# Patient Record
Sex: Male | Born: 1980
Health system: Southern US, Community
[De-identification: ages and names within clinical notes are randomized; demographics above are authoritative.]

## PROBLEM LIST (undated history)

## (undated) DIAGNOSIS — F32A Depression, unspecified: Secondary | ICD-10-CM

## (undated) DIAGNOSIS — I639 Cerebral infarction, unspecified: Secondary | ICD-10-CM

## (undated) DIAGNOSIS — N200 Calculus of kidney: Secondary | ICD-10-CM

## (undated) DIAGNOSIS — F209 Schizophrenia, unspecified: Secondary | ICD-10-CM

## (undated) DIAGNOSIS — I10 Essential (primary) hypertension: Secondary | ICD-10-CM

## (undated) HISTORY — PX: FRACTURE SURGERY: SHX138

## (undated) HISTORY — PX: OTHER SURGICAL HISTORY: SHX169

---
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
## 2000-01-05 ENCOUNTER — Emergency Department (HOSPITAL_COMMUNITY): Admission: EM | Admit: 2000-01-05 | Discharge: 2000-01-05 | Payer: Self-pay | Admitting: Emergency Medicine

## 2000-01-11 ENCOUNTER — Encounter: Admission: RE | Admit: 2000-01-11 | Discharge: 2000-01-11 | Payer: Self-pay | Admitting: Internal Medicine

## 2000-04-29 ENCOUNTER — Encounter: Payer: Self-pay | Admitting: Emergency Medicine

## 2000-04-29 ENCOUNTER — Emergency Department (HOSPITAL_COMMUNITY): Admission: EM | Admit: 2000-04-29 | Discharge: 2000-04-29 | Payer: Self-pay | Admitting: Emergency Medicine

## 2000-06-01 ENCOUNTER — Emergency Department (HOSPITAL_COMMUNITY): Admission: EM | Admit: 2000-06-01 | Discharge: 2000-06-01 | Payer: Self-pay | Admitting: Emergency Medicine

## 2000-06-04 ENCOUNTER — Encounter: Payer: Self-pay | Admitting: Emergency Medicine

## 2000-06-04 ENCOUNTER — Emergency Department (HOSPITAL_COMMUNITY): Admission: EM | Admit: 2000-06-04 | Discharge: 2000-06-04 | Payer: Self-pay | Admitting: Emergency Medicine

## 2000-06-07 ENCOUNTER — Emergency Department (HOSPITAL_COMMUNITY): Admission: EM | Admit: 2000-06-07 | Discharge: 2000-06-08 | Payer: Self-pay | Admitting: *Deleted

## 2000-06-09 ENCOUNTER — Emergency Department (HOSPITAL_COMMUNITY): Admission: EM | Admit: 2000-06-09 | Discharge: 2000-06-09 | Payer: Self-pay | Admitting: Emergency Medicine

## 2000-06-17 ENCOUNTER — Emergency Department (HOSPITAL_COMMUNITY): Admission: EM | Admit: 2000-06-17 | Discharge: 2000-06-17 | Payer: Self-pay | Admitting: Emergency Medicine

## 2000-06-20 ENCOUNTER — Encounter: Payer: Self-pay | Admitting: Emergency Medicine

## 2000-06-20 ENCOUNTER — Emergency Department (HOSPITAL_COMMUNITY): Admission: EM | Admit: 2000-06-20 | Discharge: 2000-06-20 | Payer: Self-pay | Admitting: Emergency Medicine

## 2000-07-07 ENCOUNTER — Emergency Department (HOSPITAL_COMMUNITY): Admission: EM | Admit: 2000-07-07 | Discharge: 2000-07-07 | Payer: Self-pay | Admitting: Emergency Medicine

## 2000-07-07 ENCOUNTER — Encounter: Payer: Self-pay | Admitting: Emergency Medicine

## 2000-09-04 ENCOUNTER — Encounter: Payer: Self-pay | Admitting: Emergency Medicine

## 2000-09-04 ENCOUNTER — Emergency Department (HOSPITAL_COMMUNITY): Admission: EM | Admit: 2000-09-04 | Discharge: 2000-09-04 | Payer: Self-pay | Admitting: Emergency Medicine

## 2000-09-10 ENCOUNTER — Emergency Department (HOSPITAL_COMMUNITY): Admission: EM | Admit: 2000-09-10 | Discharge: 2000-09-10 | Payer: Self-pay | Admitting: Emergency Medicine

## 2000-09-10 ENCOUNTER — Encounter: Payer: Self-pay | Admitting: Emergency Medicine

## 2000-09-22 ENCOUNTER — Emergency Department (HOSPITAL_COMMUNITY): Admission: EM | Admit: 2000-09-22 | Discharge: 2000-09-22 | Payer: Self-pay | Admitting: Emergency Medicine

## 2000-09-27 ENCOUNTER — Emergency Department (HOSPITAL_COMMUNITY): Admission: EM | Admit: 2000-09-27 | Discharge: 2000-09-27 | Payer: Self-pay | Admitting: Emergency Medicine

## 2000-10-15 ENCOUNTER — Emergency Department (HOSPITAL_COMMUNITY): Admission: EM | Admit: 2000-10-15 | Discharge: 2000-10-15 | Payer: Self-pay | Admitting: Emergency Medicine

## 2000-11-12 ENCOUNTER — Emergency Department (HOSPITAL_COMMUNITY): Admission: EM | Admit: 2000-11-12 | Discharge: 2000-11-12 | Payer: Self-pay | Admitting: Emergency Medicine

## 2000-11-12 ENCOUNTER — Encounter: Payer: Self-pay | Admitting: Emergency Medicine

## 2001-01-21 ENCOUNTER — Emergency Department (HOSPITAL_COMMUNITY): Admission: EM | Admit: 2001-01-21 | Discharge: 2001-01-21 | Payer: Self-pay | Admitting: Emergency Medicine

## 2001-01-22 ENCOUNTER — Emergency Department (HOSPITAL_COMMUNITY): Admission: EM | Admit: 2001-01-22 | Discharge: 2001-01-22 | Payer: Self-pay | Admitting: Emergency Medicine

## 2001-05-08 ENCOUNTER — Emergency Department (HOSPITAL_COMMUNITY): Admission: EM | Admit: 2001-05-08 | Discharge: 2001-05-08 | Payer: Self-pay | Admitting: Emergency Medicine

## 2001-06-07 ENCOUNTER — Emergency Department (HOSPITAL_COMMUNITY): Admission: EM | Admit: 2001-06-07 | Discharge: 2001-06-07 | Payer: Self-pay | Admitting: Emergency Medicine

## 2001-06-22 ENCOUNTER — Encounter: Payer: Self-pay | Admitting: Emergency Medicine

## 2001-06-22 ENCOUNTER — Emergency Department (HOSPITAL_COMMUNITY): Admission: EM | Admit: 2001-06-22 | Discharge: 2001-06-22 | Payer: Self-pay

## 2001-07-06 ENCOUNTER — Emergency Department (HOSPITAL_COMMUNITY): Admission: EM | Admit: 2001-07-06 | Discharge: 2001-07-06 | Payer: Self-pay

## 2001-07-08 ENCOUNTER — Encounter: Admission: RE | Admit: 2001-07-08 | Discharge: 2001-10-06 | Payer: Self-pay

## 2001-08-09 ENCOUNTER — Emergency Department (HOSPITAL_COMMUNITY): Admission: EM | Admit: 2001-08-09 | Discharge: 2001-08-10 | Payer: Self-pay | Admitting: *Deleted

## 2001-08-10 ENCOUNTER — Encounter: Payer: Self-pay | Admitting: Emergency Medicine

## 2001-10-10 ENCOUNTER — Encounter: Admission: RE | Admit: 2001-10-10 | Discharge: 2002-01-08 | Payer: Self-pay | Admitting: Anesthesiology

## 2001-10-17 ENCOUNTER — Emergency Department (HOSPITAL_COMMUNITY): Admission: EM | Admit: 2001-10-17 | Discharge: 2001-10-17 | Payer: Self-pay

## 2001-11-01 ENCOUNTER — Encounter: Payer: Self-pay | Admitting: Emergency Medicine

## 2001-11-01 ENCOUNTER — Emergency Department (HOSPITAL_COMMUNITY): Admission: EM | Admit: 2001-11-01 | Discharge: 2001-11-01 | Payer: Self-pay | Admitting: Emergency Medicine

## 2001-11-10 ENCOUNTER — Emergency Department (HOSPITAL_COMMUNITY): Admission: EM | Admit: 2001-11-10 | Discharge: 2001-11-10 | Payer: Self-pay | Admitting: Emergency Medicine

## 2002-04-14 ENCOUNTER — Encounter: Payer: Self-pay | Admitting: Emergency Medicine

## 2002-04-14 ENCOUNTER — Emergency Department (HOSPITAL_COMMUNITY): Admission: EM | Admit: 2002-04-14 | Discharge: 2002-04-14 | Payer: Self-pay | Admitting: Emergency Medicine

## 2002-04-25 ENCOUNTER — Emergency Department (HOSPITAL_COMMUNITY): Admission: EM | Admit: 2002-04-25 | Discharge: 2002-04-25 | Payer: Self-pay | Admitting: Emergency Medicine

## 2002-04-26 ENCOUNTER — Emergency Department (HOSPITAL_COMMUNITY): Admission: EM | Admit: 2002-04-26 | Discharge: 2002-04-26 | Payer: Self-pay | Admitting: Emergency Medicine

## 2002-04-26 ENCOUNTER — Encounter: Payer: Self-pay | Admitting: Emergency Medicine

## 2002-05-08 ENCOUNTER — Emergency Department (HOSPITAL_COMMUNITY): Admission: EM | Admit: 2002-05-08 | Discharge: 2002-05-08 | Payer: Self-pay | Admitting: Emergency Medicine

## 2002-05-08 ENCOUNTER — Encounter: Payer: Self-pay | Admitting: Emergency Medicine

## 2002-05-21 ENCOUNTER — Emergency Department (HOSPITAL_COMMUNITY): Admission: EM | Admit: 2002-05-21 | Discharge: 2002-05-21 | Payer: Self-pay | Admitting: *Deleted

## 2002-11-28 ENCOUNTER — Emergency Department (HOSPITAL_COMMUNITY): Admission: EM | Admit: 2002-11-28 | Discharge: 2002-11-28 | Payer: Self-pay | Admitting: *Deleted

## 2002-11-29 ENCOUNTER — Emergency Department (HOSPITAL_COMMUNITY): Admission: EM | Admit: 2002-11-29 | Discharge: 2002-11-29 | Payer: Self-pay | Admitting: Emergency Medicine

## 2003-04-13 ENCOUNTER — Emergency Department (HOSPITAL_COMMUNITY): Admission: EM | Admit: 2003-04-13 | Discharge: 2003-04-13 | Payer: Self-pay | Admitting: Emergency Medicine

## 2003-04-13 ENCOUNTER — Encounter: Payer: Self-pay | Admitting: Emergency Medicine

## 2003-05-26 ENCOUNTER — Encounter: Payer: Self-pay | Admitting: Emergency Medicine

## 2003-05-26 ENCOUNTER — Emergency Department (HOSPITAL_COMMUNITY): Admission: EM | Admit: 2003-05-26 | Discharge: 2003-05-26 | Payer: Self-pay | Admitting: Emergency Medicine

## 2003-10-30 ENCOUNTER — Emergency Department (HOSPITAL_COMMUNITY): Admission: EM | Admit: 2003-10-30 | Discharge: 2003-10-31 | Payer: Self-pay | Admitting: *Deleted

## 2004-05-03 ENCOUNTER — Emergency Department (HOSPITAL_COMMUNITY): Admission: EM | Admit: 2004-05-03 | Discharge: 2004-05-03 | Payer: Self-pay | Admitting: Emergency Medicine

## 2004-10-23 ENCOUNTER — Emergency Department (HOSPITAL_COMMUNITY): Admission: EM | Admit: 2004-10-23 | Discharge: 2004-10-23 | Payer: Self-pay | Admitting: Family Medicine

## 2004-12-26 ENCOUNTER — Emergency Department (HOSPITAL_COMMUNITY): Admission: EM | Admit: 2004-12-26 | Discharge: 2004-12-26 | Payer: Self-pay | Admitting: Emergency Medicine

## 2005-06-28 ENCOUNTER — Emergency Department (HOSPITAL_COMMUNITY): Admission: EM | Admit: 2005-06-28 | Discharge: 2005-06-29 | Payer: Self-pay | Admitting: Emergency Medicine

## 2005-07-12 ENCOUNTER — Emergency Department (HOSPITAL_COMMUNITY): Admission: EM | Admit: 2005-07-12 | Discharge: 2005-07-12 | Payer: Self-pay | Admitting: Emergency Medicine

## 2005-07-28 ENCOUNTER — Emergency Department (HOSPITAL_COMMUNITY): Admission: EM | Admit: 2005-07-28 | Discharge: 2005-07-28 | Payer: Self-pay | Admitting: Emergency Medicine

## 2005-08-01 ENCOUNTER — Emergency Department (HOSPITAL_COMMUNITY): Admission: EM | Admit: 2005-08-01 | Discharge: 2005-08-02 | Payer: Self-pay | Admitting: Emergency Medicine

## 2005-08-17 ENCOUNTER — Emergency Department (HOSPITAL_COMMUNITY): Admission: EM | Admit: 2005-08-17 | Discharge: 2005-08-17 | Payer: Self-pay | Admitting: *Deleted

## 2005-12-05 ENCOUNTER — Emergency Department (HOSPITAL_COMMUNITY): Admission: EM | Admit: 2005-12-05 | Discharge: 2005-12-05 | Payer: Self-pay | Admitting: Family Medicine

## 2005-12-17 ENCOUNTER — Emergency Department (HOSPITAL_COMMUNITY): Admission: EM | Admit: 2005-12-17 | Discharge: 2005-12-18 | Payer: Self-pay | Admitting: Emergency Medicine

## 2005-12-18 ENCOUNTER — Emergency Department (HOSPITAL_COMMUNITY): Admission: EM | Admit: 2005-12-18 | Discharge: 2005-12-18 | Payer: Self-pay | Admitting: Family Medicine

## 2006-06-03 ENCOUNTER — Emergency Department (HOSPITAL_COMMUNITY): Admission: EM | Admit: 2006-06-03 | Discharge: 2006-06-03 | Payer: Self-pay | Admitting: Emergency Medicine

## 2006-07-01 ENCOUNTER — Emergency Department (HOSPITAL_COMMUNITY): Admission: EM | Admit: 2006-07-01 | Discharge: 2006-07-01 | Payer: Self-pay | Admitting: Emergency Medicine

## 2006-08-25 ENCOUNTER — Emergency Department (HOSPITAL_COMMUNITY): Admission: EM | Admit: 2006-08-25 | Discharge: 2006-08-25 | Payer: Self-pay | Admitting: Emergency Medicine

## 2006-09-09 ENCOUNTER — Emergency Department (HOSPITAL_COMMUNITY): Admission: EM | Admit: 2006-09-09 | Discharge: 2006-09-09 | Payer: Self-pay | Admitting: Emergency Medicine

## 2006-09-18 ENCOUNTER — Emergency Department (HOSPITAL_COMMUNITY): Admission: EM | Admit: 2006-09-18 | Discharge: 2006-09-19 | Payer: Self-pay | Admitting: Emergency Medicine

## 2006-10-05 ENCOUNTER — Emergency Department (HOSPITAL_COMMUNITY): Admission: EM | Admit: 2006-10-05 | Discharge: 2006-10-05 | Payer: Self-pay | Admitting: Emergency Medicine

## 2007-01-06 IMAGING — CT CT PELVIS W/O CM
1 series · 15 of 32 positions shown, 19 images · IV contrast (agent unspecified)
Comparison: 12/18/05.

CLINICAL DATA: Right flank pain and groin pain.  History of kidney stones. 
 ABDOMEN CT WITHOUT CONTRAST:
TECHNIQUE: Multidetector CT imaging of the abdomen was performed following the standard protocol without IV contrast.
TECHNIQUE: Multidetector CT imaging of the pelvis was performed following the standard protocol without IV contrast.

[Series 2: stone_wo 5.0 b40f st · axial · 0.71mm/px · z∈[-550,-162]mm · 15 of 108 slices shown, 19 images]
[im 7/108  soft-tissue]
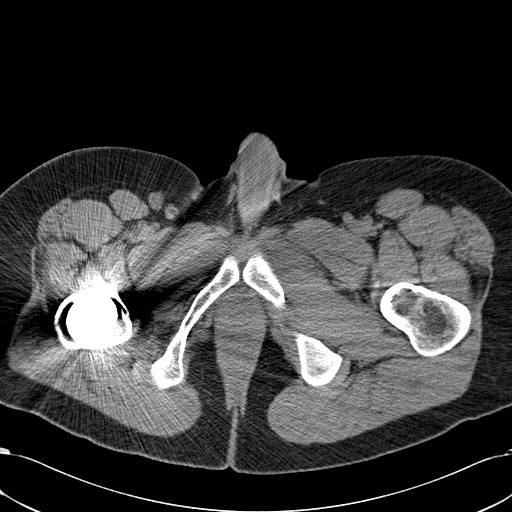
[im 7/108  bone]
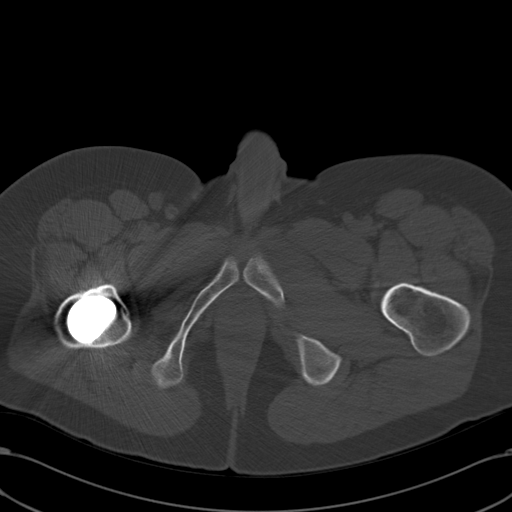
[im 14/108  soft-tissue]
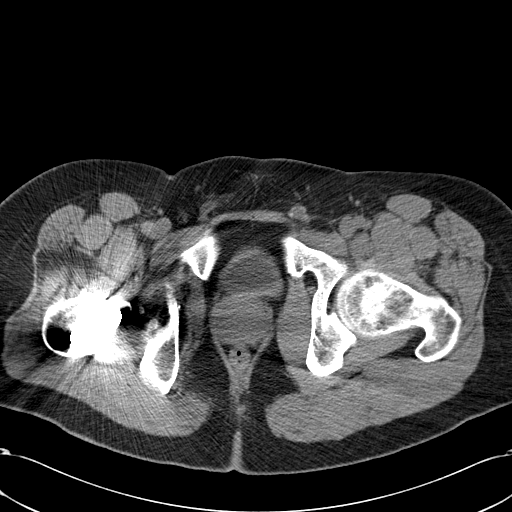
[im 21/108  soft-tissue]
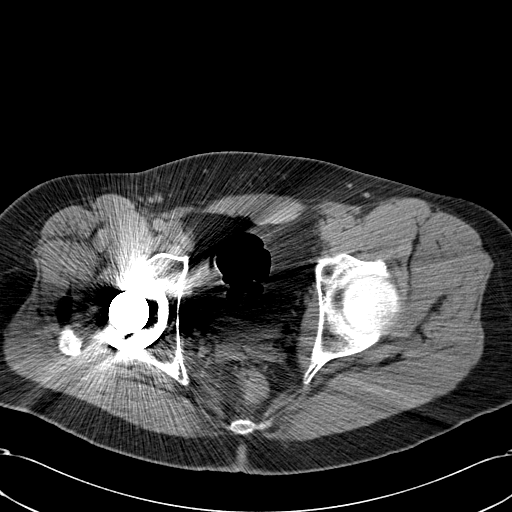
[im 32/108  soft-tissue]
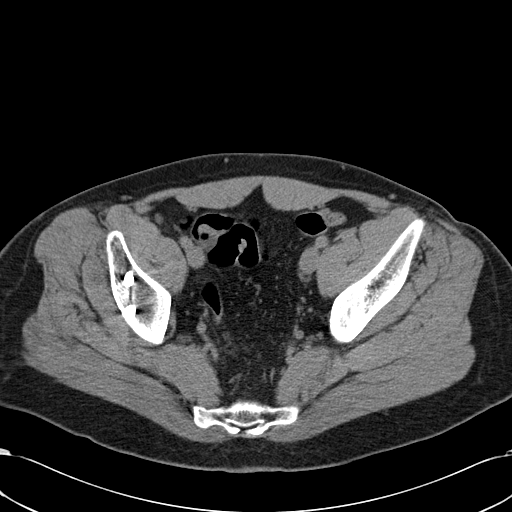
[im 38/108  soft-tissue]
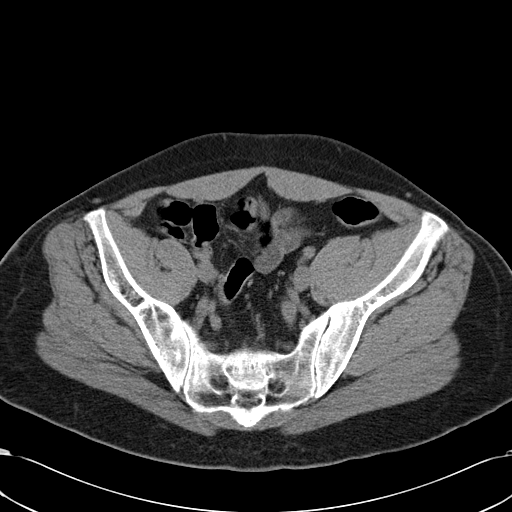
[im 45/108  soft-tissue]
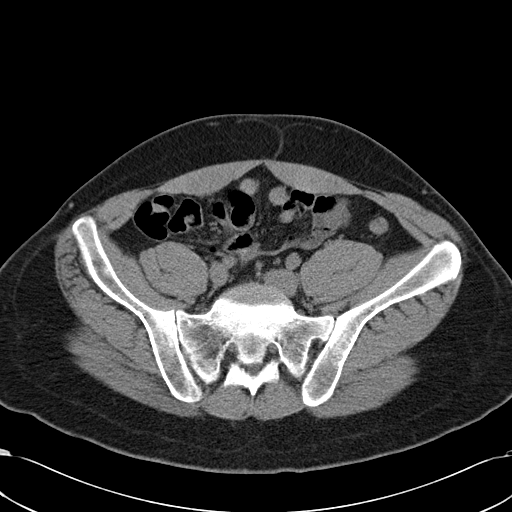
[im 56/108  soft-tissue]
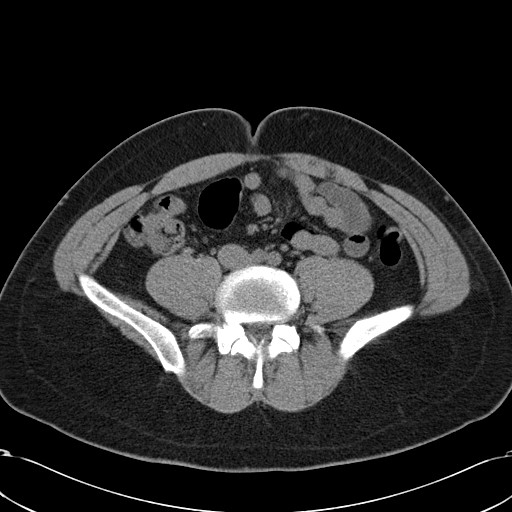
[im 63/108  soft-tissue]
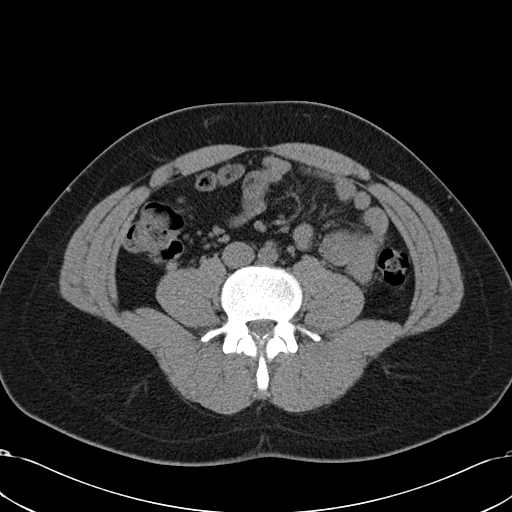
[im 70/108  soft-tissue]
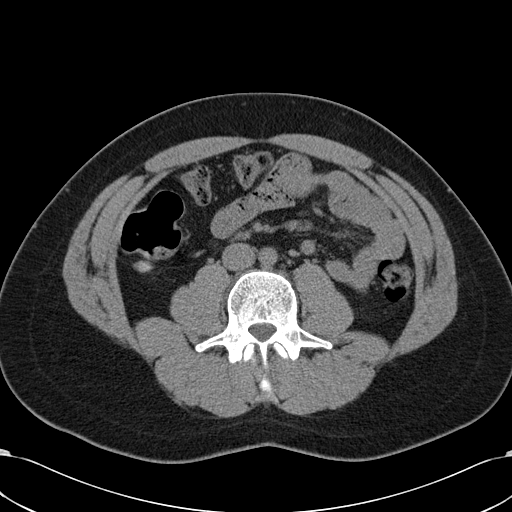
[im 70/108  bone]
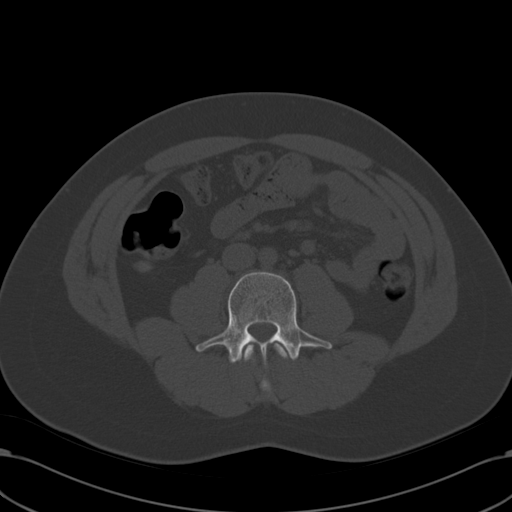
[im 76/108  soft-tissue]
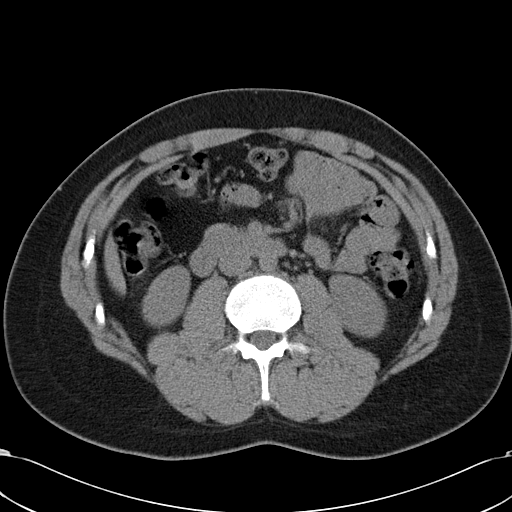
[im 87/108  soft-tissue]
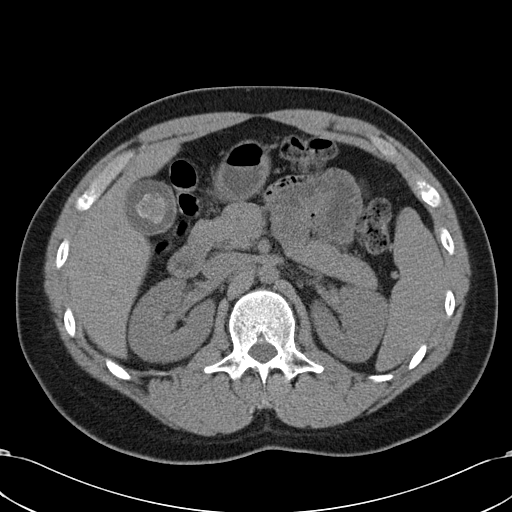
[im 94/108  soft-tissue]
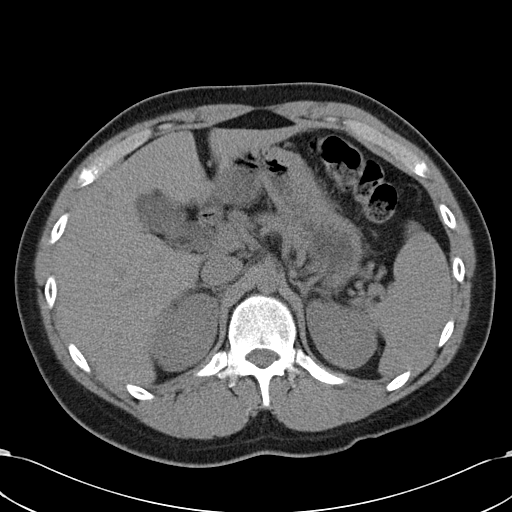
[im 94/108  lung]
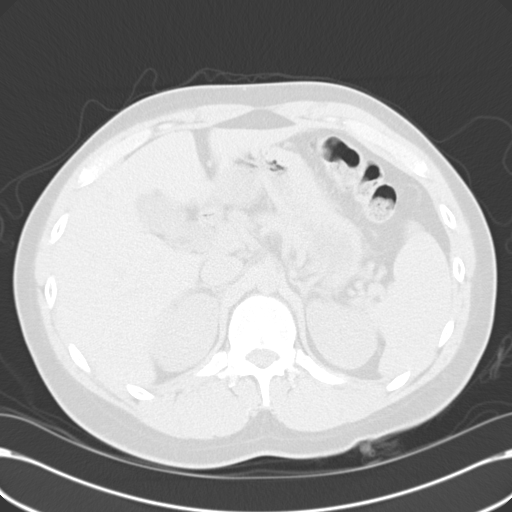
[im 97/108  lung]
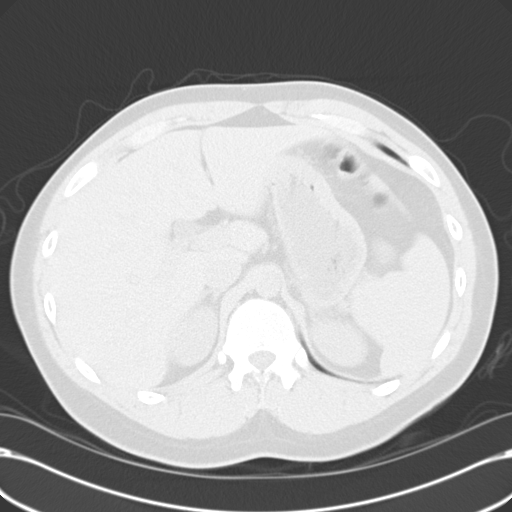
[im 101/108  soft-tissue]
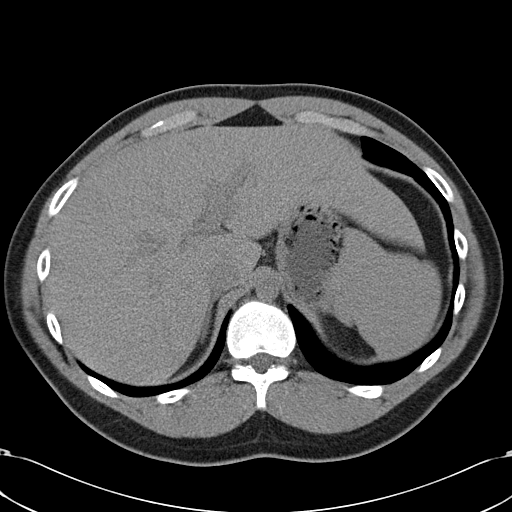
[im 101/108  lung]
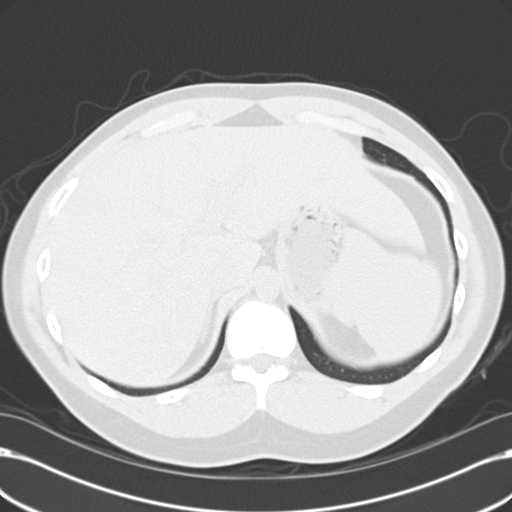
[im 104/108  lung]
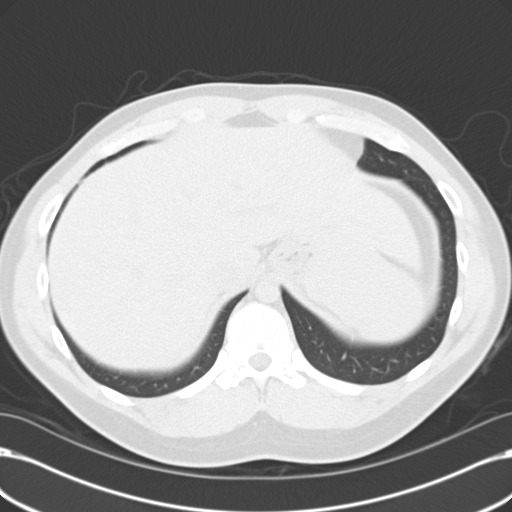

[15 of 32 positions shown; findings below may reference images not displayed]

FINDINGS: The visualized lung base is clear.  
 The visualized portions of the liver parenchyma are normal.  There is a calcified gallstone within the gallbladder lumen.
 The spleen is negative.  
 The visualized portions of the pancreatic parenchyma are negative.  Punctate stone in lower pole collecting system of left kidney is noted unchanged from prior exam.    There is no evidence for right-sided hydronephrosis or hydroureter.  The left kidney is negative.  
 The appendix is borderline thickened measuring 8 mm in diameter.  Previously, the appendix measured 6.4 mm.  Could this patient have acute appendicitis?  
 There are no periappendiceal fluid collections.  No evidence for perforation.    There are scattered mesenteric lymph nodes adjacent to the cecum. 
 No retroperitoneal adenopathy.  The visualized bowel loops are otherwise unremarkable.  No abnormally dilated loops of bowel are noted.  The bone windows show no lytic or sclerotic lesions.
IMPRESSION: 1.  Right lower pole renal stone without evidence for hydronephrosis, hydroureter or ureterolithiasis. 
 2.    Mildly thickened appendix.  Could this patient have acute appendicitis?  
 3.  Calcified gallstone. 
 PELVIS CT WITHOUT CONTRAST:
FINDINGS: There is no free fluid.  There is a right hip prosthesis with streak artifact obscuring portions of the pelvis.  No ureterolithiasis or bladder stones are noted.
IMPRESSION: 1.  Status-post right total hip arthroplasty.  
 2.  No acute pelvic CT findings.

## 2014-06-07 ENCOUNTER — Encounter (HOSPITAL_COMMUNITY): Payer: Self-pay | Admitting: Emergency Medicine

## 2014-06-07 ENCOUNTER — Emergency Department (HOSPITAL_COMMUNITY)
Admission: EM | Admit: 2014-06-07 | Discharge: 2014-06-07 | Discharge status: Against Medical Advice | Payer: Self-pay | Attending: Emergency Medicine | Admitting: Emergency Medicine

## 2014-06-07 DIAGNOSIS — W01198A Fall on same level from slipping, tripping and stumbling with subsequent striking against other object, initial encounter: Secondary | ICD-10-CM | POA: Insufficient documentation

## 2014-06-07 DIAGNOSIS — Y929 Unspecified place or not applicable: Secondary | ICD-10-CM | POA: Insufficient documentation

## 2014-06-07 DIAGNOSIS — Y9383 Activity, rough housing and horseplay: Secondary | ICD-10-CM | POA: Insufficient documentation

## 2014-06-07 DIAGNOSIS — S0993XA Unspecified injury of face, initial encounter: Secondary | ICD-10-CM | POA: Insufficient documentation

## 2014-06-07 MED ORDER — FENTANYL CITRATE 0.05 MG/ML IJ SOLN
50.0000 ug | Freq: Once | INTRAMUSCULAR | Status: AC
  Administered 2014-06-07: 50 ug via NASAL

## 2014-06-07 MED ORDER — FENTANYL CITRATE 0.05 MG/ML IJ SOLN
INTRAMUSCULAR | Status: AC
  Filled 2014-06-07: qty 2

## 2014-06-07 NOTE — ED Notes (Signed)
Pt states Thursday he was wrestling with nephew, tripped and fell on a stump, hitting face.  Pt able to speak, but has only been able to sip through straws.  Swelling noted to L jaw as well as pt states L cheek and both lips are numb.

## 2014-06-07 NOTE — ED Notes (Signed)
Pt left pager in lobby and no answer

## 2014-06-07 NOTE — ED Notes (Signed)
Unable to locate when called for room 

## 2014-06-29 ENCOUNTER — Emergency Department (HOSPITAL_COMMUNITY)
Admission: EM | Admit: 2014-06-29 | Discharge: 2014-06-29 | Disposition: A | Discharge status: Home / Self Care | Payer: Self-pay | Attending: Emergency Medicine | Admitting: Emergency Medicine

## 2014-06-29 ENCOUNTER — Encounter (HOSPITAL_COMMUNITY): Payer: Self-pay

## 2014-06-29 ENCOUNTER — Emergency Department (HOSPITAL_COMMUNITY): Payer: Self-pay

## 2014-06-29 DIAGNOSIS — Z79899 Other long term (current) drug therapy: Secondary | ICD-10-CM | POA: Insufficient documentation

## 2014-06-29 DIAGNOSIS — R7401 Elevation of levels of liver transaminase levels: Secondary | ICD-10-CM

## 2014-06-29 DIAGNOSIS — K219 Gastro-esophageal reflux disease without esophagitis: Secondary | ICD-10-CM | POA: Insufficient documentation

## 2014-06-29 DIAGNOSIS — R10819 Abdominal tenderness, unspecified site: Secondary | ICD-10-CM

## 2014-06-29 DIAGNOSIS — Z720 Tobacco use: Secondary | ICD-10-CM | POA: Insufficient documentation

## 2014-06-29 DIAGNOSIS — R740 Nonspecific elevation of levels of transaminase and lactic acid dehydrogenase [LDH]: Secondary | ICD-10-CM | POA: Insufficient documentation

## 2014-06-29 LAB — COMPREHENSIVE METABOLIC PANEL WITH GFR
ALT: 102 U/L — ABNORMAL HIGH (ref 0–53)
AST: 67 U/L — ABNORMAL HIGH (ref 0–37)
Albumin: 4.5 g/dL (ref 3.5–5.2)
Alkaline Phosphatase: 133 U/L — ABNORMAL HIGH (ref 39–117)
Anion gap: 13 (ref 5–15)
BUN: 13 mg/dL (ref 6–23)
CO2: 27 meq/L (ref 19–32)
Calcium: 9.9 mg/dL (ref 8.4–10.5)
Chloride: 102 meq/L (ref 96–112)
Creatinine, Ser: 0.81 mg/dL (ref 0.50–1.35)
GFR calc Af Amer: >90 mL/min
GFR calc non Af Amer: >90 mL/min
Glucose, Bld: 97 mg/dL (ref 70–99)
Potassium: 4.6 meq/L (ref 3.7–5.3)
Sodium: 142 meq/L (ref 137–147)
Total Bilirubin: 0.2 mg/dL — ABNORMAL LOW (ref 0.3–1.2)
Total Protein: 8.1 g/dL (ref 6.0–8.3)

## 2014-06-29 LAB — CBC WITH DIFFERENTIAL/PLATELET
Basophils Absolute: 0 10*3/uL (ref 0.0–0.1)
Basophils Relative: 1 % (ref 0–1)
Eosinophils Absolute: 0.1 10*3/uL (ref 0.0–0.7)
Eosinophils Relative: 1 % (ref 0–5)
HCT: 44.9 % (ref 39.0–52.0)
Hemoglobin: 15.9 g/dL (ref 13.0–17.0)
Lymphocytes Relative: 22 % (ref 12–46)
Lymphs Abs: 1.3 10*3/uL (ref 0.7–4.0)
MCH: 30.4 pg (ref 26.0–34.0)
MCHC: 35.4 g/dL (ref 30.0–36.0)
MCV: 85.9 fL (ref 78.0–100.0)
Monocytes Absolute: 0.4 10*3/uL (ref 0.1–1.0)
Monocytes Relative: 7 % (ref 3–12)
Neutro Abs: 4.2 10*3/uL (ref 1.7–7.7)
Neutrophils Relative %: 69 % (ref 43–77)
Platelets: 208 10*3/uL (ref 150–400)
RBC: 5.23 MIL/uL (ref 4.22–5.81)
RDW: 12.3 % (ref 11.5–15.5)
WBC: 6.1 10*3/uL (ref 4.0–10.5)

## 2014-06-29 LAB — URINALYSIS, ROUTINE W REFLEX MICROSCOPIC
Bilirubin Urine: NEGATIVE
Glucose, UA: NEGATIVE mg/dL
Hgb urine dipstick: NEGATIVE
Ketones, ur: NEGATIVE mg/dL
Leukocytes, UA: NEGATIVE
Nitrite: NEGATIVE
Protein, ur: NEGATIVE mg/dL
Specific Gravity, Urine: 1.004 — ABNORMAL LOW (ref 1.005–1.030)
Urobilinogen, UA: 0.2 mg/dL (ref 0.0–1.0)
pH: 6 (ref 5.0–8.0)

## 2014-06-29 LAB — LIPASE, BLOOD: Lipase: 47 U/L (ref 11–59)

## 2014-06-29 MED ORDER — KETOROLAC TROMETHAMINE 30 MG/ML IJ SOLN
30.0000 mg | Freq: Once | INTRAMUSCULAR | Status: AC
  Administered 2014-06-29: 30 mg via INTRAVENOUS
  Filled 2014-06-29: qty 1

## 2014-06-29 MED ORDER — IOHEXOL 300 MG/ML  SOLN
100.0000 mL | Freq: Once | INTRAMUSCULAR | Status: AC | PRN
  Administered 2014-06-29: 100 mL via INTRAVENOUS

## 2014-06-29 MED ORDER — PANTOPRAZOLE SODIUM 20 MG PO TBEC: 20.0000 mg | DELAYED_RELEASE_TABLET | Freq: Every day | ORAL | Status: DC

## 2014-06-29 MED ORDER — GI COCKTAIL ~~LOC~~
30.0000 mL | Freq: Once | ORAL | Status: AC
  Administered 2014-06-29: 30 mL via ORAL
  Filled 2014-06-29: qty 30

## 2014-06-29 MED ORDER — IOHEXOL 300 MG/ML  SOLN
50.0000 mL | Freq: Once | INTRAMUSCULAR | Status: AC | PRN
  Administered 2014-06-29: 50 mL via ORAL

## 2014-06-29 MED ORDER — SODIUM CHLORIDE 0.9 % IV BOLUS (SEPSIS)
1000.0000 mL | Freq: Once | INTRAVENOUS | Status: AC
  Administered 2014-06-29: 1000 mL via INTRAVENOUS

## 2014-06-29 NOTE — ED Notes (Signed)
Pt reports lower abdominal pain for 4 months post meal. Pt denies previous evaluation for complaint. Pt reports feels better when he rubs area during painful event. Pt reports normal BM yesterday.

## 2014-06-29 NOTE — ED Notes (Signed)
Called for pt, no response

## 2014-06-29 NOTE — Discharge Instructions (Signed)
Return to the emergency room with worsening of symptoms, new symptoms or with symptoms that are concerning, especially chest pain that feels like a pressure, spreads to left arm or jaw, worse with exertion, associated with nausea, vomiting, shortness of breath and/or sweating. Start taking protonix or OTC omeprazole 20mg  once daily for 14 days. Miralax: 3 capfuls in large Gatorade over the course of the day when stool becomes loose decrease to one capful a day for 1 week. As needed for constipation.     Please call your doctor for a followup appointment within 24-48 hours. When you talk to your doctor please let them know that you were seen in the emergency department and have them acquire all of your records so that they can discuss the findings with you and formulate a treatment plan to fully care for your new and ongoing problems. If you do not have a primary care provider please call the number below under ED resources to establish care with a provider and follow up.    Emergency Department Resource Guide 1) Find a Doctor and Pay Out of Pocket Although you won't have to find out who is covered by your insurance plan, it is a good idea to ask around and get recommendations. You will then need to call the office and see if the doctor you have chosen will accept you as a new patient and what types of options they offer for patients who are self-pay. Some doctors offer discounts or will set up payment plans for their patients who do not have insurance, but you will need to ask so you aren't surprised when you get to your appointment.  2) Contact Your Local Health Department Not all health departments have doctors that can see patients for sick visits, but many do, so it is worth a call to see if yours does. If you don't know where your local health department is, you can check in your phone book. The CDC also has a tool to help you locate your state's health department, and many state websites also  have listings of all of their local health departments.  3) Find a Walk-in Clinic If your illness is not likely to be very severe or complicated, you may want to try a walk in clinic. These are popping up all over the country in pharmacies, drugstores, and shopping centers. They're usually staffed by nurse practitioners or physician assistants that have been trained to treat common illnesses and complaints. They're usually fairly quick and inexpensive. However, if you have serious medical issues or chronic medical problems, these are probably not your best option.  No Primary Care Doctor: - Call Health Connect at  [phone] - they can help you locate a primary care doctor that  accepts your insurance, provides certain services, etc. - Physician Referral Service- [phone]  Chronic Pain Problems: Organization         Address  Phone   Notes  [facility]  [phone]) [phone] Patients need to be referred by their primary care doctor.   Medication Assistance: Organization         Address  Phone   Notes  [facility] Medication [facility] [address] [place], [place] [id number] [phone] --Must be a resident of [facility] [place] -- Must have NO insurance coverage whatsoever (no Medicaid/ Medicare, etc.) -- The pt. MUST have a primary care doctor that directs their care regularly and follows them in the community   MedAssist  (  [phone]   [employer]  [phone]    Agencies that provide inexpensive medical care: Organization         Address  Phone   Notes  [place]  [phone]   [facility]    [phone]   [facility] [place], [place] [address] [phone]   [place] [address], [place] [phone]   Planned Parenthood    [phone]   [facility]    [phone]   [place] and [place] [address], [place] Phone:  [phone], Fax:  [phone] Hours of Operation:  9 am - 6 pm, M-F.  Also accepts Medicaid/Medicare and self-pay.  [facility] for [address], [address], [place] Phone: [phone], Fax: [phone]. Hours of Operation:  8:30 am - 5:30 pm, M-F.  Also accepts Medicaid and self-pay.  [facility] [place] [address], [place] Phone: [phone]   [place], [place], [place] [phone], Ext. [phone] Mondays & Thursdays: 7-9 AM.  First 15 patients are seen on a first come, first serve basis.    [place] Providers:  Organization         Address  Phone   Notes  [facility] [address], [place] [phone] Also accepts self-pay patients.  [facility] [address], [place]  [phone]   [place], [address], [place] [phone]   [facility] [address] [place] [phone]   Lucianne Lei [address], [place]   [phone] Only accepts [place] Access [place] patients after they have their name applied to their card.   Self-Pay (no insurance) in [facility]:  Organization         Address  Phone   Notes  Sickle Cell Patients, [facility] [place] [phone]   [facility] [place] [phone]   [facility] [place], [place], [place] [phone]   [facility]/Dr. Osei-Bonsu  [address] [place] or [address], [place] [phone] Phone number for both [place] and [place] locations is the same.  [facility] [address] [place] [phone]   [facility] [address],  [place] or [address] [phone]   [facility] [address] [place] [phone], phone; [phone], fax Sees patients 1st and 3rd Saturday of every month.  Must not qualify for public or private insurance (i.e. Medicaid, Medicare, [place] Health Choice, Veterans' Benefits)  Household income should be no more than 200% of the poverty level The clinic cannot treat you if you are pregnant or think you are pregnant  Sexually transmitted diseases are not treated at the clinic.    Dental Care: Organization         Address  Phone  Notes  [facility] [place] [phone] Accepts children up to age 41 who are enrolled in [place] or [place]; pregnant women  with a Medicaid card; and children who have applied for Medicaid or [place] Health Choice, but were declined, whose parents can pay a reduced fee at time of service.  [facility]  [address], [place] [phone] Accepts children up to age 63 who are enrolled in [place] or [place]; pregnant women with a Medicaid card; and children who have applied for Medicaid or [place] Health Choice, but were declined, whose parents can pay a reduced fee at time of service.  [place] Adult Dental Access PROGRAM  [place] [phone] Patients are seen by appointment only. Walk-ins are not accepted. [place] will see patients 8 years of age and older. Monday - Tuesday (8am-5pm) Most Wednesdays (8:30-5pm) $30 per visit, cash only  [facility] Adult Dental Access PROGRAM  [address], [facility] [phone] Patients are seen by appointment only. Walk-ins are not accepted. [place] will see patients 37 years of age and older. One Wednesday Evening (Monthly: Volunteer Based).  $30 per visit, cash only  [place]  [phone] for adults; Children under age 66, call Graduate Pediatric Dentistry at [phone]. Children aged 21-14, please call [phone] to request a pediatric application.  Dental services are provided in all areas of dental care including fillings, crowns and bridges, complete and partial dentures, implants, gum treatment, root canals, and extractions. Preventive care is also provided. Treatment is provided to both adults and children. Patients are selected via a lottery and there is often a waiting list.   [facility] [address] [place]  [phone] [email or web address]   [facility] [address] [place], [place] [phone], Ext. [phone] Second and Fourth Thursday of each month, opens at 6:30 AM; Clinic ends at 9 AM.  Patients are seen on a first-come first-served basis, and a limited number are seen during each clinic.   [facility]  [address] Hillard Danker [place], [place] [phone]   Eligibility Requirements You must have lived in [place], [place], or [place] counties for at least the last three months.   You cannot be eligible for state or federal sponsored [employer], including [employer], [place], or [employer].   You generally cannot be eligible for healthcare insurance through your employer.    How to apply: Eligibility screenings are held every Tuesday and Wednesday afternoon from 1:00 pm until 4:00 pm. You do not need an appointment for the interview!  [facility] [address] [place], [place]   [place]  [place] Department  [place]  [phone]    Behavioral Health Resources in the Community: Intensive Outpatient Programs Organization         Address  Phone  Notes  [facility] [place]. [address] [place], [place] [phone]     [facility] Outpatient [address], [place], [place]   ADS: [facility] Drug [facility] [address], [place], [place]   [place] [address]  [place], [place] or [phone]   Substance Abuse Resources Organization         Address  Phone  Notes  [facility] Drug Services  [phone]   [facility]  [phone]   [facility]  [phone]  [facility]  [phone]   Residential & Outpatient Substance Abuse Program  [phone]   Psychological Services Organization         Address  Phone  Notes  [facility]  [phone]   [facility]  [phone]- [phone]   [facility] [address], [place] [phone] or [phone]    Mobile Crisis Teams Organization         Address  Phone  Notes  [facility], Mobile Crisis Care Unit  [phone]   [facility]  [address] [place], [place] [phone]   Doristine LocksSharon DeEsch [address] [place] [phone]    Self-Help/Support Groups Organization         Address  Phone             Notes  [facility] [place] - variety of support groups  336- [phone] Call for more information  Narcotics Anonymous (NA), [facility] [address], [place]  2 meetings at this location   Statisticianesidential Treatment Programs Organization         Address  Phone  Notes  [facility] [address],    [place]  [phone]   [facility]  [address], [place] [phone], [place], [place] [phone]   [facility] [address], [place] [phone] Admissions: 8am-3pm M-F  [facility] [address]    [place], [place] [phone]   [facility] [address], [place], [place] [phone]   [facility] [address]  [place],  [place] [phone]   [facility] - Intensive Outpatient [address], [place], [place] [phone]   [facility] ([facility] [address]  [place], [place] [phone] or [phone]   [facility] ([facility]) [address] [place], [place] [phone] Accepts Medicaid  [facility] [address]  [place] [phone] Substance Abuse/Addiction Treatment   [facility] Behavioral Health Resources Organization         Address  Phone  Notes  [facility]  [phone](888) [phone]   Angie FavaJulie Brannon, PhD [address] [place], [place]   [phone](336) [phone] or [phone](336) [phone]   [facility]   [address] [place], [place] [phone]) [phone]   [facility] [address], [place], [place] [phone]) [phone] Insurance/Medicaid/sponsorship through [facility]  [facility]ith and Families [address]                                    [place], [place] [phone]) [phone] Therapy/tele-psych/case  [facility] [address].   [place], [place] [phone]) [phone]    Dr. Lolly MustacheArfeen  [phone](336) [phone]   [facility]  [employer] [facility] 1) [address], [place] 2) [address], [place] 3)  [address], [place] [phone]) [phone](336) [phone]  [phone](336) [phone]   [facility] Child Abuse Hotline 8780555107(336) [phone] or [phone](336) [phone] (After Hours)

## 2014-06-29 NOTE — ED Provider Notes (Signed)
CSN: [id number]     Arrival date & time 06/29/14  1316 History   First MD Initiated Contact with Patient 06/29/14 1507     Chief Complaint  Patient presents with  . Abdominal Pain     (Consider location/radiation/quality/duration/timing/severity/associated sxs/prior Treatment) HPI  Bradley Mcgrath is a 33 y.o. male presenting with lower sharp abdominal pain for 4 months one hour after eating. Pain is sharp and lasts for about 3 hours and gradual gets better. Pt has not taken any medications for this. Pain worse when drinking coffee and eating chocolate. Better with defecation. Pt reports he has eaten less and lost 26ibs over these 4 months. Pt released from prison yesterday and was evaluated with labs and no imaging and was told it was due to a bacteria infection. Pt denies n/v/d. No abdominal surgeries. Last BM yesterday and was loose nonbloody. No hematochezia or melena. Pt with extensive tattoos and reports recent evaluation for hepatitis and HIV which were negative. He denies history of alcohol use, illicit drug use other than marijuana. In prior social history he admitted to drinking 14 cans per day but hasn't recently because he has been incarcerated. No fevers, chills. No nightsweats, reported 26ib weight loss.   History reviewed. No pertinent past medical history. Past Surgical History  Procedure Laterality Date  . Fracture surgery     History reviewed. No pertinent family history. History  Substance Use Topics  . Smoking status: Current Every Day Smoker -- 1.00 packs/day    Types: Cigarettes  . Smokeless tobacco: Not on file  . Alcohol Use: Yes     Comment: 14 cans beer per day    Review of Systems  Constitutional: Negative for fever and chills.  HENT: Negative for congestion and rhinorrhea.   Eyes: Negative for visual disturbance.  Respiratory: Negative for cough and shortness of breath.   Cardiovascular: Negative for chest pain and palpitations.  Gastrointestinal: Positive  for diarrhea. Negative for nausea and vomiting.  Genitourinary: Negative for dysuria and hematuria.  Musculoskeletal: Negative for gait problem.  Skin: Negative for rash.  Neurological: Negative for weakness and headaches.      Allergies  Review of patient's allergies indicates no known allergies.  Home Medications   Prior to Admission medications   Medication Sig Start Date End Date Taking? Authorizing Provider  buprenorphine-naloxone (SUBOXONE) 8-2 MG SUBL SL tablet Place 1 tablet under the tongue daily.   Yes Historical Provider, MD  pantoprazole (PROTONIX) 20 MG tablet Take 1 tablet (20 mg total) by mouth daily. 06/29/14   Benetta SparVictoria L Lyllian Gause, PA-C   BP 141/79 mmHg  Pulse 81  Temp(Src) 98.1 F (36.7 C) (Oral)  Resp 16  SpO2 100% Physical Exam  Constitutional: He appears well-developed and well-nourished. No distress.  HENT:  Head: Normocephalic and atraumatic.  Eyes: Conjunctivae and EOM are normal. Right eye exhibits no discharge. Left eye exhibits no discharge.  Cardiovascular: Normal rate, regular rhythm and normal heart sounds.   Pulmonary/Chest: Effort normal and breath sounds normal. No respiratory distress. He has no wheezes.  Abdominal: Soft. Bowel sounds are normal. He exhibits no distension.  Mild RLQ tenderness without rebound, guarding, rigidity or signs of peritonitis. No CVA tenderness.  Neurological: He is alert. He exhibits normal muscle tone. Coordination normal.  Skin: Skin is warm and dry. He is not diaphoretic.  Nursing note and vitals reviewed.   ED Course  Procedures (including critical care time) Labs Review Labs Reviewed  COMPREHENSIVE METABOLIC PANEL - Abnormal; Notable  for the following:    AST 67 (*)    ALT 102 (*)    Alkaline Phosphatase 133 (*)    Total Bilirubin 0.2 (*)    All other components within normal limits  URINALYSIS, ROUTINE W REFLEX MICROSCOPIC - Abnormal; Notable for the following:    Specific Gravity, Urine 1.004 (*)     All other components within normal limits  CBC WITH DIFFERENTIAL  LIPASE, BLOOD    Imaging Review Ct Abdomen Pelvis W Contrast  06/29/2014   CLINICAL DATA:  Abdominal pain, weight loss. Pain is greatest in the right upper quadrant.  EXAM: CT ABDOMEN AND PELVIS WITH CONTRAST  TECHNIQUE: Multidetector CT imaging of the abdomen and pelvis was performed using the standard protocol following bolus administration of intravenous contrast.  CONTRAST:  50mL OMNIPAQUE IOHEXOL 300 MG/ML SOLN, 100mL OMNIPAQUE IOHEXOL 300 MG/ML SOLN  COMPARISON:  08/25/2006.  FINDINGS: Lower chest: Lung bases show no acute findings. Heart size normal. No pericardial or pleural effusion.  Hepatobiliary: Mild intrahepatic biliary ductal dilatation. Extrahepatic bile duct measures up to 1.3 cm, stable. A 2.3 x 3.9 cm stone is seen in the gallbladder. Liver is otherwise unremarkable. Difficult to exclude a tiny hyperdense stone in the lower common bile duct (series 2, image 28).  Pancreas: Negative.  Spleen: Negative.  Adrenals/Urinary Tract: Adrenal glands are unremarkable. Tiny stone in the right kidney. Kidneys are otherwise unremarkable. Ureters are decompressed. Bladder is unremarkable.  Stomach/Bowel: Stomach, small bowel and appendix are unremarkable. A fair amount of stool is seen in the colon which is otherwise unremarkable.  Vascular/Lymphatic: Vascular structures are grossly unremarkable. No pathologically enlarged lymph nodes.  Reproductive: Prostate is normal in size.  Other: No free fluid.  Mesenteries and peritoneum are unremarkable.  Musculoskeletal: Right hip arthroplasty. No worrisome lytic or sclerotic lesions.  IMPRESSION: 1. No acute findings to explain the patient's symptoms. 2. Cholelithiasis. Difficult to definitively exclude a tiny hyper attenuating stone in the lower common bile duct. Biliary ductal dilatation is stable from 08/25/2006. 3. Tiny right renal stone. 4. Fair amount of stool in the colon is indicative of  constipation.   Electronically Signed   By: Leanna BattlesMelinda  Blietz M.D.   On: 06/29/2014 18:28     EKG Interpretation None      MDM   Final diagnoses:  Abdominal tenderness  Gastroesophageal reflux disease, esophagitis presence not specified  Elevated transaminase level   Pt with four-month history of intermittent abdominal pain worse one hour after eating and improves with defecation. Patient has not taken anything for this pain. Decreased eating due to the pain Resulted in 26 pound weight loss over 4 months. Patient denies history of alcohol use even though it is documented in the social history that he has 14 cans per day. VSS. Mild RLQ abdominal tenderness without signs of peritonitis. Patient states pain is much better after GI cocktail. Patient found to have mildly elevated LFTs. Other labs reassuring. CT abdomen without acute findings to explain patient's symptoms. Patient without a PCP. Patient to establish care and follow up due to LFTs. ED resources provided. Pt appears reliable for follow up and is agreeable to discharge. tx with PPI and follow up. A fair amount of stool in colon. Will treat with Miralax.  Discussed return precautions with patient. Discussed all results and patient verbalizes understanding and agrees with plan.  Case has been discussed with Dr. Micheline Mazeocherty who agrees with the above plan and to discharge.  Louann Sjogren, PA-C 06/29/14 1911  Toy Cookey, MD 06/30/14 [phone]

## 2014-06-29 NOTE — ED Notes (Signed)
Pt states he has abdominal pain since 4 months ago. Pt unable to take in foods and has lost 26 lbs.  Only able to eat certain foods.  Pt was told it was a bacteria infection.  Pt no n/v/d.  States this is bc he limits what he eats to prevent it.

## 2014-07-13 ENCOUNTER — Encounter: Payer: Self-pay | Admitting: Internal Medicine

## 2014-07-13 ENCOUNTER — Ambulatory Visit: Payer: Self-pay | Attending: Internal Medicine | Admitting: Internal Medicine

## 2014-07-13 VITALS — BP 145/88 | HR 87 | Temp 98.2°F | Resp 15 | Ht 72.0 in | Wt 172.0 lb

## 2014-07-13 DIAGNOSIS — K8020 Calculus of gallbladder without cholecystitis without obstruction: Secondary | ICD-10-CM | POA: Insufficient documentation

## 2014-07-13 DIAGNOSIS — R030 Elevated blood-pressure reading, without diagnosis of hypertension: Secondary | ICD-10-CM

## 2014-07-13 DIAGNOSIS — IMO0001 Reserved for inherently not codable concepts without codable children: Secondary | ICD-10-CM

## 2014-07-13 DIAGNOSIS — K5900 Constipation, unspecified: Secondary | ICD-10-CM | POA: Insufficient documentation

## 2014-07-13 DIAGNOSIS — F1099 Alcohol use, unspecified with unspecified alcohol-induced disorder: Secondary | ICD-10-CM | POA: Insufficient documentation

## 2014-07-13 DIAGNOSIS — R7989 Other specified abnormal findings of blood chemistry: Secondary | ICD-10-CM

## 2014-07-13 DIAGNOSIS — R1010 Upper abdominal pain, unspecified: Secondary | ICD-10-CM | POA: Insufficient documentation

## 2014-07-13 DIAGNOSIS — Z79891 Long term (current) use of opiate analgesic: Secondary | ICD-10-CM | POA: Insufficient documentation

## 2014-07-13 DIAGNOSIS — F17210 Nicotine dependence, cigarettes, uncomplicated: Secondary | ICD-10-CM | POA: Insufficient documentation

## 2014-07-13 DIAGNOSIS — R945 Abnormal results of liver function studies: Secondary | ICD-10-CM | POA: Insufficient documentation

## 2014-07-13 DIAGNOSIS — L03115 Cellulitis of right lower limb: Secondary | ICD-10-CM | POA: Insufficient documentation

## 2014-07-13 MED ORDER — POLYETHYLENE GLYCOL 3350 17 GM/SCOOP PO POWD: 17.0000 g | Freq: Two times a day (BID) | ORAL | Status: DC | PRN

## 2014-07-13 MED ORDER — PANTOPRAZOLE SODIUM 20 MG PO TBEC: 20.0000 mg | DELAYED_RELEASE_TABLET | Freq: Every day | ORAL | Status: DC

## 2014-07-13 MED ORDER — CEPHALEXIN 500 MG PO CAPS: 500.0000 mg | ORAL_CAPSULE | Freq: Four times a day (QID) | ORAL | Status: DC

## 2014-07-13 NOTE — Patient Instructions (Addendum)
DASH Eating Plan DASH stands for "Dietary Approaches to Stop Hypertension." The DASH eating plan is a healthy eating plan that has been shown to reduce high blood pressure (hypertension). Additional health benefits may include reducing the risk of type 2 diabetes mellitus, heart disease, and stroke. The DASH eating plan may also help with weight loss. WHAT DO I NEED TO KNOW ABOUT THE DASH EATING PLAN? For the DASH eating plan, you will follow these general guidelines:  Choose foods with a percent daily value for sodium of less than 5% (as listed on the food label).  Use salt-free seasonings or herbs instead of table salt or sea salt.  Check with your health care provider or pharmacist before using salt substitutes.  Eat lower-sodium products, often labeled as "lower sodium" or "no salt added."  Eat fresh foods.  Eat more vegetables, fruits, and low-fat dairy products.  Choose whole grains. Look for the word "whole" as the first word in the ingredient list.  Choose fish and skinless chicken or turkey more often than red meat. Limit fish, poultry, and meat to 6 oz (170 g) each day.  Limit sweets, desserts, sugars, and sugary drinks.  Choose heart-healthy fats.  Limit cheese to 1 oz (28 g) per day.  Eat more home-cooked food and less restaurant, buffet, and fast food.  Limit fried foods.  Cook foods using methods other than frying.  Limit canned vegetables. If you do use them, rinse them well to decrease the sodium.  When eating at a restaurant, ask that your food be prepared with less salt, or no salt if possible. WHAT FOODS CAN I EAT? Seek help from a dietitian for individual calorie needs. Grains Whole grain or whole wheat bread. Brown rice. Whole grain or whole wheat pasta. Quinoa, bulgur, and whole grain cereals. Low-sodium cereals. Corn or whole wheat flour tortillas. Whole grain cornbread. Whole grain crackers. Low-sodium crackers. Vegetables Fresh or frozen vegetables  (raw, steamed, roasted, or grilled). Low-sodium or reduced-sodium tomato and vegetable juices. Low-sodium or reduced-sodium tomato sauce and paste. Low-sodium or reduced-sodium canned vegetables.  Fruits All fresh, canned (in natural juice), or frozen fruits. Meat and Other Protein Products Ground beef (85% or leaner), grass-fed beef, or beef trimmed of fat. Skinless chicken or turkey. Ground chicken or turkey. Pork trimmed of fat. All fish and seafood. Eggs. Dried beans, peas, or lentils. Unsalted nuts and seeds. Unsalted canned beans. Dairy Low-fat dairy products, such as skim or 1% milk, 2% or reduced-fat cheeses, low-fat ricotta or cottage cheese, or plain low-fat yogurt. Low-sodium or reduced-sodium cheeses. Fats and Oils Tub margarines without trans fats. Light or reduced-fat mayonnaise and salad dressings (reduced sodium). Avocado. Safflower, olive, or canola oils. Natural peanut or almond butter. Other Unsalted popcorn and pretzels. The items listed above may not be a complete list of recommended foods or beverages. Contact your dietitian for more options. WHAT FOODS ARE NOT RECOMMENDED? Grains White bread. White pasta. White rice. Refined cornbread. Bagels and croissants. Crackers that contain trans fat. Vegetables Creamed or fried vegetables. Vegetables in a cheese sauce. Regular canned vegetables. Regular canned tomato sauce and paste. Regular tomato and vegetable juices. Fruits Dried fruits. Canned fruit in light or heavy syrup. Fruit juice. Meat and Other Protein Products Fatty cuts of meat. Ribs, chicken wings, bacon, sausage, bologna, salami, chitterlings, fatback, hot dogs, bratwurst, and packaged luncheon meats. Salted nuts and seeds. Canned beans with salt. Dairy Whole or 2% milk, cream, half-and-half, and cream cheese. Whole-fat or sweetened yogurt. Full-fat   cheeses or blue cheese. Nondairy creamers and whipped toppings. Processed cheese, cheese spreads, or cheese  curds. Condiments Onion and garlic salt, seasoned salt, table salt, and sea salt. Canned and packaged gravies. Worcestershire sauce. Tartar sauce. Barbecue sauce. Teriyaki sauce. Soy sauce, including reduced sodium. Steak sauce. Fish sauce. Oyster sauce. Cocktail sauce. Horseradish. Ketchup and mustard. Meat flavorings and tenderizers. Bouillon cubes. Hot sauce. Tabasco sauce. Marinades. Taco seasonings. Relishes. Fats and Oils Butter, stick margarine, lard, shortening, ghee, and bacon fat. Coconut, palm kernel, or palm oils. Regular salad dressings. Other Pickles and olives. Salted popcorn and pretzels. The items listed above may not be a complete list of foods and beverages to avoid. Contact your dietitian for more information. WHERE CAN I FIND MORE INFORMATION? National Heart, Lung, and [employer] Institute: www.nhlbi.nih.gov/health/health-topics/topics/dash/ Document Released: 07/19/2011 Document Revised: 12/14/2013 Document Reviewed: 06/03/2013 ExitCare Patient Information 2015 [employer]. This information is not intended to replace advice given to you by your health care provider. Make sure you discuss any questions you have with your health care provider. High-Fiber Diet Fiber is found in fruits, vegetables, and grains. A high-fiber diet encourages the addition of more whole grains, legumes, fruits, and vegetables in your diet. The recommended amount of fiber for adult males is 38 g per day. For adult females, it is 25 g per day. Pregnant and lactating women should get 28 g of fiber per day. If you have a digestive or bowel problem, ask your caregiver for advice before adding high-fiber foods to your diet. Eat a variety of high-fiber foods instead of only a select few type of foods.  PURPOSE  To increase stool bulk.  To make bowel movements more regular to prevent constipation.  To lower cholesterol.  To prevent overeating. WHEN IS THIS DIET USED?  It may be used if you have  constipation and hemorrhoids.  It may be used if you have uncomplicated diverticulosis (intestine condition) and irritable bowel syndrome.  It may be used if you need help with weight management.  It may be used if you want to add it to your diet as a protective measure against atherosclerosis, diabetes, and cancer. SOURCES OF FIBER  Whole-grain breads and cereals.  Fruits, such as apples, oranges, bananas, berries, prunes, and pears.  Vegetables, such as green peas, carrots, sweet potatoes, beets, broccoli, cabbage, spinach, and artichokes.  Legumes, such split peas, soy, lentils.  Almonds. FIBER CONTENT IN FOODS Starches and Grains / Dietary Fiber (g)  Cheerios, 1 cup / 3 g  Corn Flakes cereal, 1 cup / 0.7 g  Rice crispy treat cereal, 1 cup / 0.3 g  Instant oatmeal (cooked),  cup / 2 g  Frosted wheat cereal, 1 cup / 5.1 g  Brown, long-grain rice (cooked), 1 cup / 3.5 g  White, long-grain rice (cooked), 1 cup / 0.6 g  Enriched macaroni (cooked), 1 cup / 2.5 g Legumes / Dietary Fiber (g)  Baked beans (canned, plain, or vegetarian),  cup / 5.2 g  Kidney beans (canned),  cup / 6.8 g  Pinto beans (cooked),  cup / 5.5 g Breads and Crackers / Dietary Fiber (g)  Plain or honey graham crackers, 2 squares / 0.7 g  Saltine crackers, 3 squares / 0.3 g  Plain, salted pretzels, 10 pieces / 1.8 g  Whole-wheat bread, 1 slice / 1.9 g  White bread, 1 slice / 0.7 g  Raisin bread, 1 slice / 1.2 g  Plain bagel, 3 oz / 2 g  Flour   tortilla, 1 oz / 0.9 g  Corn tortilla, 1 small / 1.5 g  Hamburger or hotdog bun, 1 small / 0.9 g Fruits / Dietary Fiber (g)  Apple with skin, 1 medium / 4.4 g  Sweetened applesauce,  cup / 1.5 g  Banana,  medium / 1.5 g  Grapes, 10 grapes / 0.4 g  Orange, 1 small / 2.3 g  Raisin, 1.5 oz / 1.6 g  Melon, 1 cup / 1.4 g Vegetables / Dietary Fiber (g)  Green beans (canned),  cup / 1.3 g  Carrots (cooked),  cup / 2.3  g  Broccoli (cooked),  cup / 2.8 g  Peas (cooked),  cup / 4.4 g  Mashed potatoes,  cup / 1.6 g  Lettuce, 1 cup / 0.5 g  Corn (canned),  cup / 1.6 g  Tomato,  cup / 1.1 g Document Released: 07/30/2005 Document Revised: 01/29/2012 Document Reviewed: 11/01/2011 ExitCare Patient Information 2015 [employer]. This information is not intended to replace advice given to you by your health care provider. Make sure you discuss any questions you have with your health care provider.  

## 2014-07-13 NOTE — Progress Notes (Signed)
Patient is here to establish care.  He states he has liver issues and wants it checked out. C/o right leg pain and edema due to tanning bed x 1 week.  He has used neosporin on the area.

## 2014-07-13 NOTE — Progress Notes (Signed)
Patient Demographics  Bradley Mcgrath, is a 33 y.o. male  FAO:[id number]  ONG:[id number]  DOB - April 25, 1981  CC:  Chief Complaint  Patient presents with  . Establish Care       HPI: Bradley OkRoger Gaynor is a 33 y.o. male here today to establish medical care.patient recently and emergency room with symptoms of abdominal pain, EMR reviewed patient had a CT scan done which reported gallstones as per patient he has noticed the pain is more worse when he eats fried food, patient also reports constipation, he was prescribed Protonix and wax which patient has not taken her medications, also noticed to have abnormal LFTs, patient denies any history of IV drugs used to drink alcohol and smoked  cigarettes in the past, patient also reported to have right leg pain and swelling for the last one week ?secondary to tanning bed , patient has been applying topical antibiotic ointment.patient denies any fever chills any discharge. Patient has No headache, No chest pain, No abdominal pain - No Nausea, No new weakness tingling or numbness, No Cough - SOB.  No Known Allergies No past medical history on file. Current Outpatient Prescriptions on File Prior to Visit  Medication Sig Dispense Refill  . buprenorphine-naloxone (SUBOXONE) 8-2 MG SUBL SL tablet Place 1 tablet under the tongue daily.     No current facility-administered medications on file prior to visit.   Family History  Problem Relation Age of Onset  . Cancer Mother   . Hyperlipidemia Father   . Cancer Father   . Diabetes Father   . Cancer Daughter   . Diabetes Paternal Grandmother    History   Social History  . Marital Status: Single    Spouse Name: N/A    Number of Children: N/A  . Years of Education: N/A   Occupational History  . Not on file.   Social History Main Topics  . Smoking status: Current Every Day Smoker -- 1.00 packs/day    Types: Cigarettes  . Smokeless tobacco: Not on file  . Alcohol Use: Yes     Comment: 14 cans beer  per day  . Drug Use: Not on file  . Sexual Activity: Not on file   Other Topics Concern  . Not on file   Social History Narrative    Review of Systems: Constitutional: Negative for fever, chills, diaphoresis, activity change, appetite change and fatigue. HENT: Negative for ear pain, nosebleeds, congestion, facial swelling, rhinorrhea, neck pain, neck stiffness and ear discharge.  Eyes: Negative for pain, discharge, redness, itching and visual disturbance. Respiratory: Negative for cough, choking, chest tightness, shortness of breath, wheezing and stridor.  Cardiovascular: Negative for chest pain, palpitations and leg swelling. Gastrointestinal: Negative for abdominal distention. Genitourinary: Negative for dysuria, urgency, frequency, hematuria, flank pain, decreased urine volume, difficulty urinating and dyspareunia.  Musculoskeletal: Negative for back pain, joint swelling, arthralgia and gait problem. Neurological: Negative for dizziness, tremors, seizures, syncope, facial asymmetry, speech difficulty, weakness, light-headedness, numbness and headaches.  Hematological: Negative for adenopathy. Does not bruise/bleed easily. Psychiatric/Behavioral: Negative for hallucinations, behavioral problems, confusion, dysphoric mood, decreased concentration and agitation.    Objective:   Filed Vitals:   07/13/14 1040  BP: 145/88  Pulse: 87  Temp: 98.2 F (36.8 C)  Resp: 15    Physical Exam: Constitutional: Patient appears well-developed and well-nourished. No distress. HENT: Normocephalic, atraumatic, External right and left ear normal. Oropharynx is clear and moist.  Eyes: Conjunctivae and EOM are normal. PERRLA, no scleral icterus. Neck:  Normal ROM. Neck supple. No JVD. No tracheal deviation. No thyromegaly. CVS: RRR, S1/S2 +, no murmurs, no gallops, no carotid bruit.  Pulmonary: Effort and breath sounds normal, no stridor, rhonchi, wheezes, rales.  Abdominal: Soft. BS +, no  distension, tenderness, rebound or guarding.  Musculoskeletal: right leg swollen warm to touch minimal  tender   Neuro: Alert. Normal reflexes, muscle tone coordination. No cranial nerve deficit. Skin: Skin is warm and dry. No rash noted. Not diaphoretic. No erythema. No pallor. Psychiatric: Normal mood and affect. Behavior, judgment, thought content normal.  Lab Results  Component Value Date   WBC 6.1 06/29/2014   HGB 15.9 06/29/2014   HCT 44.9 06/29/2014   MCV 85.9 06/29/2014   PLT 208 06/29/2014   Lab Results  Component Value Date   CREATININE 0.81 06/29/2014   BUN 13 06/29/2014   NA 142 06/29/2014   K 4.6 06/29/2014   CL 102 06/29/2014   CO2 27 06/29/2014    No results found for: HGBA1C Lipid Panel  No results found for: CHOL, TRIG, HDL, CHOLHDL, VLDL, LDLCALC     Assessment and plan:   1. Abnormal LFTs  - Hepatitis B surface antibody - Hepatitis B surface antigen - Hepatitis C antibody - HIV antibody (with reflex)  2. Gall stones  - Ambulatory referral to General Surgery  3. Constipation, unspecified constipation type Advised patient for high-fiber diet.  - polyethylene glycol powder (GLYCOLAX/MIRALAX) powder; Take 17 g by mouth 2 (two) times daily as needed.  Dispense: 3350 g; Refill: 1  4. Pain of upper abdomen Trial of Protonix. - pantoprazole (PROTONIX) 20 MG tablet; Take 1 tablet (20 mg total) by mouth daily.  Dispense: 30 tablet; Refill: 3  5. Cellulitis of leg, right  - cephALEXin (KEFLEX) 500 MG capsule; Take 1 capsule (500 mg total) by mouth 4 (four) times daily.  Dispense: 28 capsule; Refill: 0   Return in about 3 months (around 10/12/2014), or if symptoms worsen or fail to improve.    The patient was given clear instructions to go to ER or return to medical center if symptoms don't improve, worsen or new problems develop. The patient verbalized understanding. The patient was told to call to get lab results if they haven't heard anything in the  next week.     Doris CheadleADVANI, Kailand Seda, MD

## 2014-07-14 LAB — HEPATITIS C ANTIBODY: HCV Ab: NEGATIVE

## 2014-07-14 LAB — HIV ANTIBODY (ROUTINE TESTING W REFLEX): HIV 1&2 Ab, 4th Generation: REACTIVE — AB

## 2014-07-14 LAB — HEPATITIS B SURFACE ANTIBODY,QUALITATIVE: HEP B S AB: NEGATIVE

## 2014-07-14 LAB — HIV 1/2 CONFIRMATION
HIV-1 antibody: NEGATIVE
HIV-2 Ab: NEGATIVE

## 2014-07-14 LAB — HEPATITIS B SURFACE ANTIGEN: Hepatitis B Surface Ag: NEGATIVE

## 2014-07-19 LAB — HIV-1 RNA, QUALITATIVE, TMA: HIV-1 RNA, Qualitative, TMA: NOT DETECTED

## 2014-07-22 ENCOUNTER — Telehealth: Payer: Self-pay | Admitting: Emergency Medicine

## 2014-07-22 DIAGNOSIS — Z21 Asymptomatic human immunodeficiency virus [HIV] infection status: Secondary | ICD-10-CM

## 2014-07-22 NOTE — Telephone Encounter (Signed)
Pt given part of lab results with scheduled appointment today to discuss other lab results with Dr. Orpah CobbAdvani ID referral placed

## 2014-07-27 ENCOUNTER — Encounter: Payer: Self-pay | Admitting: *Deleted

## 2014-07-27 ENCOUNTER — Ambulatory Visit (INDEPENDENT_AMBULATORY_CARE_PROVIDER_SITE_OTHER): Payer: Self-pay | Admitting: Internal Medicine

## 2014-07-27 DIAGNOSIS — R945 Abnormal results of liver function studies: Secondary | ICD-10-CM

## 2014-07-27 DIAGNOSIS — R7989 Other specified abnormal findings of blood chemistry: Secondary | ICD-10-CM

## 2014-07-27 NOTE — Progress Notes (Signed)
   Subjective:    Patient ID: Bradley Mcgrath, male    DOB: 1981-07-28, 33 y.o.   MRN: [id number]  HPI The patient comes in for evaluation of his lab results.  He was newly established with his primary physician after a visit to the emergency room where he had abdominal pain and noted increased LFTs. He had hepatitis labs as well as HIV tested. All his hepatitis labs were negative for viral hepatitis in his HIV was screen positive but confirmation was negative so is negative for HIV.   Review of Systems     Objective:   Physical Exam        Assessment & Plan:

## 2014-07-27 NOTE — Telephone Encounter (Signed)
Opened in error

## 2014-07-27 NOTE — Assessment & Plan Note (Signed)
Discussed results and is negative for HIV and viral hepatitis.  Return PRN.

## 2014-09-09 ENCOUNTER — Encounter (HOSPITAL_COMMUNITY): Payer: Self-pay

## 2014-09-09 ENCOUNTER — Emergency Department (HOSPITAL_COMMUNITY): Payer: Self-pay

## 2014-09-09 ENCOUNTER — Emergency Department (HOSPITAL_COMMUNITY)
Admission: EM | Admit: 2014-09-09 | Discharge: 2014-09-09 | Disposition: A | Discharge status: Home / Self Care | Payer: Self-pay | Attending: Emergency Medicine | Admitting: Emergency Medicine

## 2014-09-09 DIAGNOSIS — K8080 Other cholelithiasis without obstruction: Secondary | ICD-10-CM | POA: Insufficient documentation

## 2014-09-09 DIAGNOSIS — Z720 Tobacco use: Secondary | ICD-10-CM | POA: Insufficient documentation

## 2014-09-09 DIAGNOSIS — R109 Unspecified abdominal pain: Secondary | ICD-10-CM

## 2014-09-09 DIAGNOSIS — Z79899 Other long term (current) drug therapy: Secondary | ICD-10-CM | POA: Insufficient documentation

## 2014-09-09 DIAGNOSIS — K8070 Calculus of gallbladder and bile duct without cholecystitis without obstruction: Secondary | ICD-10-CM

## 2014-09-09 LAB — COMPREHENSIVE METABOLIC PANEL
ALK PHOS: 87 U/L (ref 39–117)
ALT: 28 U/L (ref 0–53)
AST: 18 U/L (ref 0–37)
Albumin: 4.2 g/dL (ref 3.5–5.2)
Anion gap: 5 (ref 5–15)
BUN: 10 mg/dL (ref 6–23)
CO2: 28 mmol/L (ref 19–32)
Calcium: 9.3 mg/dL (ref 8.4–10.5)
Chloride: 107 mmol/L (ref 96–112)
Creatinine, Ser: 0.81 mg/dL (ref 0.50–1.35)
Glucose, Bld: 98 mg/dL (ref 70–99)
Potassium: 3.8 mmol/L (ref 3.5–5.1)
SODIUM: 140 mmol/L (ref 135–145)
Total Bilirubin: 0.5 mg/dL (ref 0.3–1.2)
Total Protein: 7 g/dL (ref 6.0–8.3)

## 2014-09-09 LAB — CBC WITH DIFFERENTIAL/PLATELET
BASOS ABS: 0 10*3/uL (ref 0.0–0.1)
BASOS PCT: 0 % (ref 0–1)
Eosinophils Absolute: 0.1 10*3/uL (ref 0.0–0.7)
Eosinophils Relative: 1 % (ref 0–5)
HEMATOCRIT: 43.1 % (ref 39.0–52.0)
Hemoglobin: 14.6 g/dL (ref 13.0–17.0)
Lymphocytes Relative: 21 % (ref 12–46)
Lymphs Abs: 1.2 10*3/uL (ref 0.7–4.0)
MCH: 30 pg (ref 26.0–34.0)
MCHC: 33.9 g/dL (ref 30.0–36.0)
MCV: 88.7 fL (ref 78.0–100.0)
MONO ABS: 0.4 10*3/uL (ref 0.1–1.0)
Monocytes Relative: 6 % (ref 3–12)
Neutro Abs: 4.1 10*3/uL (ref 1.7–7.7)
Neutrophils Relative %: 72 % (ref 43–77)
Platelets: 208 10*3/uL (ref 150–400)
RBC: 4.86 MIL/uL (ref 4.22–5.81)
RDW: 12.6 % (ref 11.5–15.5)
WBC: 5.7 10*3/uL (ref 4.0–10.5)

## 2014-09-09 LAB — URINALYSIS, ROUTINE W REFLEX MICROSCOPIC
Bilirubin Urine: NEGATIVE
GLUCOSE, UA: NEGATIVE mg/dL
HGB URINE DIPSTICK: NEGATIVE
Ketones, ur: NEGATIVE mg/dL
Leukocytes, UA: NEGATIVE
NITRITE: NEGATIVE
PH: 7 (ref 5.0–8.0)
Protein, ur: NEGATIVE mg/dL
Specific Gravity, Urine: 1.012 (ref 1.005–1.030)
UROBILINOGEN UA: 0.2 mg/dL (ref 0.0–1.0)

## 2014-09-09 LAB — LIPASE, BLOOD: Lipase: 25 U/L (ref 11–59)

## 2014-09-09 MED ORDER — ONDANSETRON HCL 4 MG/2ML IJ SOLN
4.0000 mg | Freq: Once | INTRAMUSCULAR | Status: AC
  Administered 2014-09-09: 4 mg via INTRAVENOUS
  Filled 2014-09-09: qty 2

## 2014-09-09 MED ORDER — MORPHINE SULFATE 4 MG/ML IJ SOLN
4.0000 mg | Freq: Once | INTRAMUSCULAR | Status: AC
  Administered 2014-09-09: 4 mg via INTRAVENOUS
  Filled 2014-09-09: qty 1

## 2014-09-09 MED ORDER — HYDROCODONE-ACETAMINOPHEN 5-325 MG PO TABS: 1.0000 | ORAL_TABLET | Freq: Four times a day (QID) | ORAL | Status: DC | PRN

## 2014-09-09 MED ORDER — PROMETHAZINE HCL 25 MG PO TABS: 25.0000 mg | ORAL_TABLET | Freq: Four times a day (QID) | ORAL | Status: DC | PRN
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
## 2014-09-09 NOTE — ED Notes (Signed)
Per pt, has gallbladder problems.  Has known for a while but unable to do anything d/t finances.  Pt was waiting for medicaid.  Pt has rt lower quad pain.  Nausea.  Burning with urination.

## 2014-09-17 ENCOUNTER — Ambulatory Visit: Payer: Self-pay | Admitting: Internal Medicine

## 2016-01-10 ENCOUNTER — Encounter (HOSPITAL_COMMUNITY): Payer: Self-pay | Admitting: Emergency Medicine

## 2016-01-10 ENCOUNTER — Emergency Department (HOSPITAL_COMMUNITY): Payer: Self-pay

## 2016-01-10 ENCOUNTER — Inpatient Hospital Stay (HOSPITAL_COMMUNITY)
Admission: EM | Admit: 2016-01-10 | Discharge: 2016-01-12 | DRG: 603 | Disposition: A | Discharge status: Home / Self Care | Payer: Self-pay | Attending: Internal Medicine | Admitting: Internal Medicine

## 2016-01-10 DIAGNOSIS — L03115 Cellulitis of right lower limb: Principal | ICD-10-CM | POA: Diagnosis present

## 2016-01-10 DIAGNOSIS — E876 Hypokalemia: Secondary | ICD-10-CM | POA: Diagnosis present

## 2016-01-10 DIAGNOSIS — F17210 Nicotine dependence, cigarettes, uncomplicated: Secondary | ICD-10-CM | POA: Diagnosis present

## 2016-01-10 LAB — CBG MONITORING, ED: Glucose-Capillary: 114 mg/dL — ABNORMAL HIGH (ref 65–99)

## 2016-01-10 LAB — CBC WITH DIFFERENTIAL/PLATELET
BASOS PCT: 0 %
Basophils Absolute: 0 10*3/uL (ref 0.0–0.1)
Eosinophils Absolute: 0.2 10*3/uL (ref 0.0–0.7)
Eosinophils Relative: 2 %
HCT: 33.6 % — ABNORMAL LOW (ref 39.0–52.0)
HEMOGLOBIN: 11.7 g/dL — AB (ref 13.0–17.0)
Lymphocytes Relative: 11 %
Lymphs Abs: 1.2 10*3/uL (ref 0.7–4.0)
MCH: 29.8 pg (ref 26.0–34.0)
MCHC: 34.8 g/dL (ref 30.0–36.0)
MCV: 85.5 fL (ref 78.0–100.0)
Monocytes Absolute: 0.8 10*3/uL (ref 0.1–1.0)
Monocytes Relative: 7 %
NEUTROS PCT: 80 %
Neutro Abs: 9 10*3/uL — ABNORMAL HIGH (ref 1.7–7.7)
Platelets: 277 10*3/uL (ref 150–400)
RBC: 3.93 MIL/uL — AB (ref 4.22–5.81)
RDW: 13 % (ref 11.5–15.5)
WBC: 11.1 10*3/uL — AB (ref 4.0–10.5)

## 2016-01-10 LAB — COMPREHENSIVE METABOLIC PANEL
ALK PHOS: 186 U/L — AB (ref 38–126)
ALT: 62 U/L (ref 17–63)
ANION GAP: 7 (ref 5–15)
AST: 54 U/L — ABNORMAL HIGH (ref 15–41)
Albumin: 2.8 g/dL — ABNORMAL LOW (ref 3.5–5.0)
BUN: 8 mg/dL (ref 6–20)
CO2: 27 mmol/L (ref 22–32)
CREATININE: 0.74 mg/dL (ref 0.61–1.24)
Calcium: 8.1 mg/dL — ABNORMAL LOW (ref 8.9–10.3)
Chloride: 101 mmol/L (ref 101–111)
GFR calc Af Amer: >60 mL/min (ref >60)
GFR calc non Af Amer: >60 mL/min (ref >60)
GLUCOSE: 119 mg/dL — AB (ref 65–99)
Potassium: 2.8 mmol/L — ABNORMAL LOW (ref 3.5–5.1)
SODIUM: 135 mmol/L (ref 135–145)
Total Bilirubin: 0.6 mg/dL (ref 0.3–1.2)
Total Protein: 7 g/dL (ref 6.5–8.1)

## 2016-01-10 LAB — I-STAT CG4 LACTIC ACID, ED: LACTIC ACID, VENOUS: 1.8 mmol/L (ref 0.5–2.0)

## 2016-01-10 MED ORDER — POTASSIUM CHLORIDE 10 MEQ/100ML IV SOLN
10.0000 meq | INTRAVENOUS | Status: AC
  Administered 2016-01-11 (×2): 10 meq via INTRAVENOUS
  Filled 2016-01-10 (×2): qty 100

## 2016-01-10 MED ORDER — ENOXAPARIN SODIUM 40 MG/0.4ML ~~LOC~~ SOLN
40.0000 mg | Freq: Every day | SUBCUTANEOUS | Status: DC
  Administered 2016-01-11 (×2): 40 mg via SUBCUTANEOUS
  Filled 2016-01-10 (×3): qty 0.4

## 2016-01-10 MED ORDER — SODIUM CHLORIDE 0.9 % IV BOLUS (SEPSIS)
1000.0000 mL | Freq: Once | INTRAVENOUS | Status: AC
  Administered 2016-01-10: 1000 mL via INTRAVENOUS

## 2016-01-10 MED ORDER — SODIUM CHLORIDE 0.9 % IV SOLN
Freq: Once | INTRAVENOUS | Status: AC
  Administered 2016-01-10: 21:00:00 via INTRAVENOUS

## 2016-01-10 MED ORDER — POTASSIUM CHLORIDE CRYS ER 20 MEQ PO TBCR: 40.0000 meq | EXTENDED_RELEASE_TABLET | Freq: Once | ORAL | Status: DC

## 2016-01-10 MED ORDER — CLINDAMYCIN PHOSPHATE 900 MG/50ML IV SOLN
900.0000 mg | Freq: Once | INTRAVENOUS | Status: AC
  Administered 2016-01-10: 900 mg via INTRAVENOUS
  Filled 2016-01-10: qty 50

## 2016-01-10 MED ORDER — HYDROMORPHONE HCL 1 MG/ML IJ SOLN
0.5000 mg | Freq: Once | INTRAMUSCULAR | Status: AC
  Administered 2016-01-10: 0.5 mg via INTRAVENOUS
  Filled 2016-01-10: qty 1

## 2016-01-10 MED ORDER — POTASSIUM CHLORIDE CRYS ER 20 MEQ PO TBCR
40.0000 meq | EXTENDED_RELEASE_TABLET | Freq: Two times a day (BID) | ORAL | Status: DC
  Administered 2016-01-10: 40 meq via ORAL
  Filled 2016-01-10: qty 2

## 2016-01-10 MED ORDER — GABAPENTIN 300 MG PO CAPS
300.0000 mg | ORAL_CAPSULE | Freq: Three times a day (TID) | ORAL | Status: DC
  Administered 2016-01-11 – 2016-01-12 (×5): 300 mg via ORAL
  Filled 2016-01-10 (×7): qty 1

## 2016-01-10 MED ORDER — DEXTROSE 5 % IV SOLN
2.0000 g | Freq: Every day | INTRAVENOUS | Status: DC
  Administered 2016-01-11 (×2): 2 g via INTRAVENOUS
  Filled 2016-01-10 (×2): qty 2

## 2016-01-10 MED ORDER — HYDROCODONE-ACETAMINOPHEN 5-325 MG PO TABS
1.0000 | ORAL_TABLET | ORAL | Status: DC | PRN
  Administered 2016-01-11 (×3): 2 via ORAL
  Administered 2016-01-11: 1 via ORAL
  Administered 2016-01-11 – 2016-01-12 (×3): 2 via ORAL
  Filled 2016-01-10 (×5): qty 2
  Filled 2016-01-10: qty 1
  Filled 2016-01-10: qty 2

## 2016-01-10 NOTE — ED Notes (Signed)
MD at bedside. 
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
## 2016-01-10 NOTE — ED Notes (Signed)
Floor will return call

## 2016-01-10 NOTE — ED Notes (Signed)
Pt transported to XR.  

## 2016-01-10 NOTE — ED Notes (Signed)
Per pt, states right leg pain, swelling and redness for a week

## 2016-01-11 ENCOUNTER — Telehealth: Payer: Self-pay

## 2016-01-11 DIAGNOSIS — L03115 Cellulitis of right lower limb: Principal | ICD-10-CM

## 2016-01-11 DIAGNOSIS — E876 Hypokalemia: Secondary | ICD-10-CM

## 2016-01-11 LAB — BASIC METABOLIC PANEL
ANION GAP: 6 (ref 5–15)
BUN: 8 mg/dL (ref 6–20)
CALCIUM: 8 mg/dL — AB (ref 8.9–10.3)
CO2: 27 mmol/L (ref 22–32)
Chloride: 107 mmol/L (ref 101–111)
Creatinine, Ser: 0.59 mg/dL — ABNORMAL LOW (ref 0.61–1.24)
GFR calc Af Amer: >60 mL/min (ref >60)
Glucose, Bld: 108 mg/dL — ABNORMAL HIGH (ref 65–99)
POTASSIUM: 3.3 mmol/L — AB (ref 3.5–5.1)
SODIUM: 140 mmol/L (ref 135–145)

## 2016-01-11 LAB — CBC
HCT: 30.3 % — ABNORMAL LOW (ref 39.0–52.0)
Hemoglobin: 10.3 g/dL — ABNORMAL LOW (ref 13.0–17.0)
MCH: 29.4 pg (ref 26.0–34.0)
MCHC: 34 g/dL (ref 30.0–36.0)
MCV: 86.6 fL (ref 78.0–100.0)
PLATELETS: 235 10*3/uL (ref 150–400)
RBC: 3.5 MIL/uL — AB (ref 4.22–5.81)
RDW: 13.5 % (ref 11.5–15.5)
WBC: 7.7 10*3/uL (ref 4.0–10.5)

## 2016-01-11 MED ORDER — MORPHINE SULFATE (PF) 2 MG/ML IV SOLN
0.5000 mg | Freq: Once | INTRAVENOUS | Status: AC
  Administered 2016-01-11: 0.5 mg via INTRAVENOUS
  Filled 2016-01-11: qty 1

## 2016-01-11 MED ORDER — POTASSIUM CHLORIDE CRYS ER 20 MEQ PO TBCR: 40.0000 meq | EXTENDED_RELEASE_TABLET | Freq: Once | ORAL | Status: DC

## 2016-01-11 NOTE — Care Management Note (Signed)
Case Management Note  Patient Details  Name: Sharion DoveRoger D Neuner MRN: [id number] Date of Birth: [birth year]  Subjective/Objective:34 y/o m admitted w/cellulitis r leg. From home. No insurance. Will provide resources-pcp listing, $4 [facility] med list, health insurance info.                    Action/Plan:d/c plan home.   Expected Discharge Date:                  Expected Discharge Plan:  Home/Self Care  In-House Referral:     Discharge planning Services  CM Consult  Post Acute Care Choice:    Choice offered to:     DME Arranged:    DME Agency:     HH Arranged:    HH Agency:     Status of Service:  In process, will continue to follow  Medicare Important Message Given:    Date Medicare IM Given:    Medicare IM give by:    Date Additional Medicare IM Given:    Additional Medicare Important Message give by:     If discussed at Long Length of Stay Meetings, dates discussed:    Additional Comments:  Lanier ClamMahabir, Roderick Sweezy, RN 01/11/2016, 12:46 PM

## 2016-01-11 NOTE — Telephone Encounter (Signed)
Message received from Lanier ClamKathy Mahabir, RN CM [facility] No appointments available at this time.  Update provided to K. Mahabir, RN CM
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
## 2016-01-11 NOTE — Progress Notes (Addendum)
Patient ID: Bradley DoveRoger D Asselin, male   DOB: 04-Jun-1981, 35 y.o.   MRN: [id number]  PROGRESS NOTE    Bradley DoveRoger D Wentzel  [id number] DOB: 04-Jun-1981 DOA: 01/10/2016  PCP: Doris CheadleADVANI, DEEPAK, MD   Brief Narrative:  35 y.o. male with no significant past medical history. He presented to [facility] with worsening right lower extremity pain, swelling and erythema over one week prior to this admission. No reports of fevers or chills. Patient was hemodynamically stable on the admission. Plain films did not demonstrate acute fractures. He was started on clindamycin in ED which was then switched to Rocephin.   Assessment & Plan:   Principal Problem:   Cellulitis of right leg / RLE pain  - Extends throughout the entire length of the right lower extremity - Continue Rocephin - Continue pain management efforts  Active Problems:   Hypokalemia - Unclear etiology - Supplemented   DVT prophylaxis: Lovenox subcutaneous Code Status: full code  Family Communication: Friend at the bedside Disposition Plan: Home once cellulitis looks little bit better, extends throughout the entire length of the extremity so most likely he will be going home in next 48 hours.   Consultants:   None  Procedures:   None  Antimicrobials:   Rocephin   Subjective: Says he still has pain in the right leg.  Objective: Filed Vitals:   01/10/16 2351 01/11/16 0521 01/11/16 0927 01/11/16 0938  BP: 129/66 134/71 129/68   Pulse: 77 67 71   Temp: 97.7 F (36.5 C) 97.7 F (36.5 C) 97.8 F (36.6 C)   TempSrc: Oral Oral Oral   Resp: 17 18 18    Height:    5\' 9"  (1.753 m)  Weight:    90.175 kg (198 lb 12.8 oz)  SpO2: 100% 100% 100%    No intake or output data in the 24 hours ending 01/11/16 1104 Filed Weights   01/11/16 0938  Weight: 90.175 kg (198 lb 12.8 oz)    Examination:  General exam: Appears calm and comfortable  Respiratory system: Clear to auscultation. Respiratory effort normal. Cardiovascular  system: S1 & S2 heard, RRR. No JVD, murmurs, rubs, gallops or clicks.  Gastrointestinal system: Abdomen is nondistended, soft and nontender. No organomegaly or masses felt. Normal bowel sounds heard. Central nervous system: Alert and oriented. No focal neurological deficits. Extremities: Symmetric 5 x 5 power. Skin: erythema over right lwoer extremity slightly better but spreads throughout the length of the leg  Psychiatry: Judgement and insight appear normal. Mood & affect appropriate.   Data Reviewed: I have personally reviewed following labs and imaging studies  CBC:  Recent Labs Lab 01/10/16 1905 01/11/16 0515  WBC 11.1* 7.7  NEUTROABS 9.0*  --   HGB 11.7* 10.3*  HCT 33.6* 30.3*  MCV 85.5 86.6  PLT 277 235   Basic Metabolic Panel:  Recent Labs Lab 01/10/16 1905 01/11/16 0515  NA 135 140  K 2.8* 3.3*  CL 101 107  CO2 27 27  GLUCOSE 119* 108*  BUN 8 8  CREATININE 0.74 0.59*  CALCIUM 8.1* 8.0*   GFR: Estimated Creatinine Clearance: 144.5 mL/min (by C-G formula based on Cr of 0.59). Liver Function Tests:  Recent Labs Lab 01/10/16 1905  AST 54*  ALT 62  ALKPHOS 186*  BILITOT 0.6  PROT 7.0  ALBUMIN 2.8*   No results for input(s): LIPASE, AMYLASE in the last 168 hours. No results for input(s): AMMONIA in the last 168 hours. Coagulation Profile: No results for input(s): INR, PROTIME in  the last 168 hours. Cardiac Enzymes: No results for input(s): CKTOTAL, CKMB, CKMBINDEX, TROPONINI in the last 168 hours. BNP (last 3 results) No results for input(s): PROBNP in the last 8760 hours. HbA1C: No results for input(s): HGBA1C in the last 72 hours. CBG:  Recent Labs Lab 01/10/16 2021  GLUCAP 114*   Lipid Profile: No results for input(s): CHOL, HDL, LDLCALC, TRIG, CHOLHDL, LDLDIRECT in the last 72 hours. Thyroid Function Tests: No results for input(s): TSH, T4TOTAL, FREET4, T3FREE, THYROIDAB in the last 72 hours. Anemia Panel: No results for input(s):  VITAMINB12, FOLATE, FERRITIN, TIBC, IRON, RETICCTPCT in the last 72 hours. Urine analysis:    Component Value Date/Time   COLORURINE YELLOW 09/09/2014 1926   APPEARANCEUR CLOUDY* 09/09/2014 1926   LABSPEC 1.012 09/09/2014 1926   PHURINE 7.0 09/09/2014 1926   GLUCOSEU NEGATIVE 09/09/2014 1926   HGBUR NEGATIVE 09/09/2014 1926   BILIRUBINUR NEGATIVE 09/09/2014 1926   KETONESUR NEGATIVE 09/09/2014 1926   PROTEINUR NEGATIVE 09/09/2014 1926   UROBILINOGEN 0.2 09/09/2014 1926   NITRITE NEGATIVE 09/09/2014 1926   LEUKOCYTESUR NEGATIVE 09/09/2014 1926   Sepsis Labs: (procalcitonin:4,lacticidven:4)   )No results found for this or any previous visit (from the past 240 hour(s)).    Radiology Studies: Dg Tibia/fibula Right 01/10/2016 No evidence of fracture or dislocation. Visualized hardware appears grossly stable from 2008. Electronically Signed   By: Roanna Raider M.D.   On: 01/10/2016 21:14   Dg Knee Complete 4 Views Right 01/10/2016   No evidence of fracture or dislocation. Electronically Signed   By: Roanna Raider M.D.   On: 01/10/2016 21:13    Scheduled Meds: . cefTRIAXone (ROCEPHIN)  IV  2 g Intravenous QHS  . enoxaparin (LOVENOX) injection  40 mg Subcutaneous QHS  . gabapentin  300 mg Oral TID  . potassium chloride  40 mEq Oral Once  . potassium chloride  40 mEq Oral Once   Continuous Infusions:      Time spent: 25 minutes  Greater than 50% of the time spent on counseling and coordinating the care.   Manson Passey, MD [facility] Pager [phone]  If 7PM-7AM, please contact night-coverage [email or web address] Password [email or web address] 01/11/2016, 11:04 AM

## 2016-01-11 NOTE — Care Management Note (Signed)
Case Management Note  Patient Details  Name: Bradley Mcgrath MRN: [id number] Date of Birth: 1981/08/10  Subjective/Objective:Patient needs pcp appt. Patient agreed to [facility] liason-Jane notified-she will look for an appt.Provided patient w/resources-pcp,$[facility] med list, health insuraqnce info.                    Action/Plan:d/c plan home.   Expected Discharge Date:                  Expected Discharge Plan:  Home/Self Care  In-House Referral:     Discharge planning Services  CM Consult, [facility]  Post Acute Care Choice:    Choice offered to:     DME Arranged:    DME Agency:     HH Arranged:    HH Agency:     Status of Service:  In process, will continue to follow  Medicare Important Message Given:    Date Medicare IM Given:    Medicare IM give by:    Date Additional Medicare IM Given:    Additional Medicare Important Message give by:     If discussed at Long Length of Stay Meetings, dates discussed:    Additional Comments:  Lanier ClamMahabir, Bradley [place]f, RN 01/11/2016, 1:37 PM

## 2016-01-11 NOTE — Progress Notes (Signed)
Patient with weeping cellulitis to right leg. This RN marked cellulitis with skin marker on arrival to unit.

## 2016-01-12 LAB — CBC
HEMATOCRIT: 34.5 % — AB (ref 39.0–52.0)
HEMOGLOBIN: 11.5 g/dL — AB (ref 13.0–17.0)
MCH: 29.4 pg (ref 26.0–34.0)
MCHC: 33.3 g/dL (ref 30.0–36.0)
MCV: 88.2 fL (ref 78.0–100.0)
Platelets: 313 10*3/uL (ref 150–400)
RBC: 3.91 MIL/uL — ABNORMAL LOW (ref 4.22–5.81)
RDW: 13.7 % (ref 11.5–15.5)
WBC: 8.2 10*3/uL (ref 4.0–10.5)

## 2016-01-12 LAB — BASIC METABOLIC PANEL
ANION GAP: 5 (ref 5–15)
BUN: 8 mg/dL (ref 6–20)
CHLORIDE: 106 mmol/L (ref 101–111)
CO2: 26 mmol/L (ref 22–32)
Calcium: 8 mg/dL — ABNORMAL LOW (ref 8.9–10.3)
Creatinine, Ser: 0.66 mg/dL (ref 0.61–1.24)
GFR calc Af Amer: >60 mL/min (ref >60)
GFR calc non Af Amer: >60 mL/min (ref >60)
GLUCOSE: 103 mg/dL — AB (ref 65–99)
POTASSIUM: 4.3 mmol/L (ref 3.5–5.1)
Sodium: 137 mmol/L (ref 135–145)

## 2016-01-12 MED ORDER — CEPHALEXIN 500 MG PO CAPS: 500.0000 mg | ORAL_CAPSULE | Freq: Four times a day (QID) | ORAL | Status: DC

## 2016-01-12 MED ORDER — CLINDAMYCIN HCL 300 MG PO CAPS: 300.0000 mg | ORAL_CAPSULE | Freq: Three times a day (TID) | ORAL | Status: DC

## 2016-01-12 MED ORDER — OXYCODONE-ACETAMINOPHEN 5-325 MG PO TABS
1.0000 | ORAL_TABLET | ORAL | Status: DC | PRN
  Administered 2016-01-12: 2 via ORAL
  Filled 2016-01-12: qty 2

## 2016-01-12 MED ORDER — KETOROLAC TROMETHAMINE 30 MG/ML IJ SOLN
30.0000 mg | Freq: Once | INTRAMUSCULAR | Status: AC
  Administered 2016-01-12: 30 mg via INTRAVENOUS
  Filled 2016-01-12: qty 1

## 2016-01-12 NOTE — Discharge Instructions (Addendum)

## 2016-01-12 NOTE — Discharge Summary (Addendum)
Physician Discharge Summary  Bradley Mcgrath [id number] DOB: Feb 15, 1981 DOA: 01/10/2016  PCP: Doris CheadleADVANI, DEEPAK, MD  Admit date: 01/10/2016 Discharge date: 01/12/2016  Recommendations for Outpatient Follow-up:  Continue Keflex 4 times daily for 7 days on discharge.  Discharge Diagnoses:  Principal Problem:   Cellulitis of right leg Active Problems:   Hypokalemia    Discharge Condition: stable   Diet recommendation: as tolerated   History of present illness:  35 y.o. male with no significant past medical history. [facility] with worsening right lower extremity pain, swelling and erythema over one week prior to this admission. No reports of fevers or chills. Patient was hemodynamically stable on the admission. Plain films did not demonstrate acute fractures. He was started on clindamycin in ED which was then switched to Rocephin.  Hospital Course:   Assessment & Plan:  Principal Problem:  Cellulitis of right leg / RLE pain  - Right lower extremity cellulitis significantly better - He is on Rocephin and he will continue Keflex for 7 days on discharge   Active Problems:  Hypokalemia - Unclear etiology - Supplemented    DVT prophylaxis: Lovenox subcutaneous Code Status: full code  Family Communication: Friend at the bedside   Consultants:   None  Procedures:   None  Antimicrobials:   Rocephin   Signed:  Manson PasseyEVINE, Carnisha Feltz, MD  [facility] 01/12/2016, 10:06 AM  Pager #: [phone]  Time spent in minutes: less than 30 minutes   Discharge Exam: Filed Vitals:   01/11/16 2142 01/12/16 0534  BP: 134/65 145/83  Pulse: 66 71  Temp: 98.2 F (36.8 C) 98.5 F (36.9 C)  Resp: 19 20   Filed Vitals:   01/11/16 0938 01/11/16 1415 01/11/16 2142 01/12/16 0534  BP:  127/77 134/65 145/83  Pulse:  88 66 71  Temp:  97.7 F (36.5 C) 98.2 F (36.8 C) 98.5 F (36.9 C)  TempSrc:  Axillary Oral Oral  Resp:  20 19 20   Height: 5'  9" (1.753 m)     Weight: 90.175 kg (198 lb 12.8 oz)     SpO2:  100% 100% 100%    General: Pt is alert, follows commands appropriately, not in acute distress Cardiovascular: Regular rate and rhythm, S1/S2 +, no murmurs Respiratory: Clear to auscultation bilaterally, no wheezing, no crackles, no rhonchi Abdominal: Soft, non tender, non distended, bowel sounds +, no guarding Extremities: Right lower extremity cellulitis improving, no cyanosis, pulses palpable bilaterally DP and PT Neuro: Grossly nonfocal  Discharge Instructions  Discharge Instructions    Call MD for:  difficulty breathing, headache or visual disturbances    Complete by:  As directed      Call MD for:  persistant nausea and vomiting    Complete by:  As directed      Call MD for:  severe uncontrolled pain    Complete by:  As directed      Diet - low sodium heart healthy    Complete by:  As directed      Discharge instructions    Complete by:  As directed        Increase activity slowly    Complete by:  As directed             Medication List    TAKE these medications        clindamycin 300 MG capsule  Commonly known as:  CLEOCIN  Take 1 capsule (300 mg total) by mouth 3 (three) times daily.  gabapentin 300 MG capsule  Commonly known as:  NEURONTIN  Take 300 mg by mouth 3 (three) times daily.           Follow-up Information    Schedule an appointment as soon as possible for a visit with [facility].   Why:  Follow up appt after recent hospitalization, As needed   Contact information:   [address] [facility] [address] [phone]       The results of significant diagnostics from this hospitalization (including imaging, microbiology, ancillary and laboratory) are listed below for reference.    Significant Diagnostic Studies: Dg Tibia/fibula Right  01/10/2016  CLINICAL DATA:  Acute onset of right midshaft pain at the tibia and fibula.  Initial encounter. EXAM: RIGHT TIBIA AND FIBULA - 2 VIEW COMPARISON:  Right tibia/fibula radiographs performed 09/09/2006 FINDINGS: A plate and screws are noted along the proximal tibia, with prior residual screw fragments also seen. This is stable from the prior study. Mild soft tissue swelling is suggested about the lower leg. Prominent soft tissue calcification is noted at the distal lower leg. The ankle mortise is incompletely assessed, but appears grossly unremarkable. The knee joint is grossly unremarkable in appearance. Soft tissue swelling is noted about the knee. IMPRESSION: No evidence of fracture or dislocation. Visualized hardware appears grossly stable from 2008. Electronically Signed   By: Roanna Raider M.D.   On: 01/10/2016 21:14   Dg Knee Complete 4 Views Right  01/10/2016  CLINICAL DATA:  Acute onset of right knee pain and pain at the midshaft of the tibia and fibula. Initial encounter. EXAM: RIGHT KNEE - COMPLETE 4+ VIEW COMPARISON:  Right tibia/fibula radiographs performed 09/09/2006 FINDINGS: There is no evidence of fracture or dislocation. The joint spaces are preserved. Prominent osteophytes are noted arising at the tibial spine. The plate and screws along the tibia are grossly unchanged in appearance, with a few small prior screw fragments. There is chronic deformity of the mid fibula. No significant joint effusion is seen. Mild soft tissue swelling is noted overlying the patella. IMPRESSION: No evidence of fracture or dislocation. Electronically Signed   By: Roanna Raider M.D.   On: 01/10/2016 21:13    Microbiology: Recent Results (from the past 240 hour(s))  Blood culture (routine x 2)     Status: None (Preliminary result)   Collection Time: 01/10/16  8:30 PM  Result Value Ref Range Status   Specimen Description BLOOD RIGHT ANTECUBITAL  Final   Special Requests BOTTLES DRAWN AEROBIC AND ANAEROBIC  Final   Culture   Final    NO GROWTH < 12 HOURS Performed at [facility]    Report Status PENDING  Incomplete  Blood culture (routine x 2)     Status: None (Preliminary result)   Collection Time: 01/10/16  8:55 PM  Result Value Ref Range Status   Specimen Description BLOOD LEFT HAND  Final   Special Requests BOTTLES DRAWN AEROBIC AND ANAEROBIC 5 ML  Final   Culture   Final    NO GROWTH < 12 HOURS Performed at [facility]    Report Status PENDING  Incomplete     Labs: Basic Metabolic Panel:  Recent Labs Lab 01/10/16 1905 01/11/16 0515 01/12/16 0543  NA 135 140 137  K 2.8* 3.3* 4.3  CL 101 107 106  CO2 27 27 26   GLUCOSE 119* 108* 103*  BUN 8 8 8   CREATININE 0.74 0.59* 0.66  CALCIUM  8.1* 8.0* 8.0*   Liver Function Tests:  Recent Labs Lab 01/10/16 1905  AST 54*  ALT 62  ALKPHOS 186*  BILITOT 0.6  PROT 7.0  ALBUMIN 2.8*   No results for input(s): LIPASE, AMYLASE in the last 168 hours. No results for input(s): AMMONIA in the last 168 hours. CBC:  Recent Labs Lab 01/10/16 1905 01/11/16 0515 01/12/16 0543  WBC 11.1* 7.7 8.2  NEUTROABS 9.0*  --   --   HGB 11.7* 10.3* 11.5*  HCT 33.6* 30.3* 34.5*  MCV 85.5 86.6 88.2  PLT 277 235 313   Cardiac Enzymes: No results for input(s): CKTOTAL, CKMB, CKMBINDEX, TROPONINI in the last 168 hours. BNP: BNP (last 3 results) No results for input(s): BNP in the last 8760 hours.  ProBNP (last 3 results) No results for input(s): PROBNP in the last 8760 hours.  CBG:  Recent Labs Lab 01/10/16 2021  GLUCAP 114*

## 2016-01-12 NOTE — Progress Notes (Signed)
IV removed. AVS reviewed at bedside. Prescriptions provided at bedside. No further questions. Justin Mendaudle, Octavis Sheeler H, RN

## 2016-01-15 LAB — CULTURE, BLOOD (ROUTINE X 2)
CULTURE: NO GROWTH
Culture: NO GROWTH

## 2016-07-10 ENCOUNTER — Emergency Department (HOSPITAL_COMMUNITY)
Admission: EM | Admit: 2016-07-10 | Discharge: 2016-07-10 | Disposition: A | Discharge status: Home / Self Care | Payer: Self-pay | Attending: Emergency Medicine | Admitting: Emergency Medicine

## 2016-07-10 ENCOUNTER — Emergency Department (HOSPITAL_COMMUNITY): Payer: Self-pay

## 2016-07-10 ENCOUNTER — Encounter (HOSPITAL_COMMUNITY): Payer: Self-pay | Admitting: *Deleted

## 2016-07-10 DIAGNOSIS — S90821A Blister (nonthermal), right foot, initial encounter: Secondary | ICD-10-CM | POA: Insufficient documentation

## 2016-07-10 DIAGNOSIS — S73004A Unspecified dislocation of right hip, initial encounter: Secondary | ICD-10-CM

## 2016-07-10 DIAGNOSIS — T148XXA Other injury of unspecified body region, initial encounter: Secondary | ICD-10-CM

## 2016-07-10 DIAGNOSIS — Y929 Unspecified place or not applicable: Secondary | ICD-10-CM | POA: Insufficient documentation

## 2016-07-10 DIAGNOSIS — F17210 Nicotine dependence, cigarettes, uncomplicated: Secondary | ICD-10-CM | POA: Insufficient documentation

## 2016-07-10 DIAGNOSIS — L03031 Cellulitis of right toe: Secondary | ICD-10-CM | POA: Insufficient documentation

## 2016-07-10 DIAGNOSIS — X58XXXA Exposure to other specified factors, initial encounter: Secondary | ICD-10-CM | POA: Insufficient documentation

## 2016-07-10 DIAGNOSIS — Y999 Unspecified external cause status: Secondary | ICD-10-CM | POA: Insufficient documentation

## 2016-07-10 DIAGNOSIS — T84020A Dislocation of internal right hip prosthesis, initial encounter: Secondary | ICD-10-CM | POA: Insufficient documentation

## 2016-07-10 DIAGNOSIS — Y792 Prosthetic and other implants, materials and accessory orthopedic devices associated with adverse incidents: Secondary | ICD-10-CM | POA: Insufficient documentation

## 2016-07-10 DIAGNOSIS — Y9389 Activity, other specified: Secondary | ICD-10-CM | POA: Insufficient documentation

## 2016-07-10 DIAGNOSIS — Z96641 Presence of right artificial hip joint: Secondary | ICD-10-CM | POA: Insufficient documentation

## 2016-07-10 MED ORDER — CEPHALEXIN 500 MG PO CAPS: 500.0000 mg | ORAL_CAPSULE | Freq: Three times a day (TID) | ORAL | 0 refills | Status: DC

## 2016-07-10 MED ORDER — CEPHALEXIN 250 MG PO CAPS
500.0000 mg | ORAL_CAPSULE | Freq: Once | ORAL | Status: AC
  Administered 2016-07-10: 500 mg via ORAL
  Filled 2016-07-10: qty 2

## 2016-07-10 NOTE — Discharge Instructions (Signed)
Use right foot shoe insert to take weight bearing pressure off of your lateral foot (Arch support)

## 2016-07-10 NOTE — ED Provider Notes (Signed)
Patient not yet in room.    Bradley LaineKevin Robbin Loughmiller, MD 07/10/16 [phone]

## 2016-07-10 NOTE — ED Triage Notes (Signed)
Pt reports that his right hip is out of joint, hx of same and usually able to pop it back in place by himself. Also reports having wound to bottom of right foot that needs to be evaluated.
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
## 2016-10-19 ENCOUNTER — Encounter (HOSPITAL_COMMUNITY): Payer: Self-pay | Admitting: *Deleted

## 2016-10-19 ENCOUNTER — Emergency Department (HOSPITAL_COMMUNITY)
Admission: EM | Admit: 2016-10-19 | Discharge: 2016-10-19 | Disposition: A | Discharge status: Home / Self Care | Payer: Self-pay | Attending: Emergency Medicine | Admitting: Emergency Medicine

## 2016-10-19 DIAGNOSIS — L03114 Cellulitis of left upper limb: Secondary | ICD-10-CM

## 2016-10-19 DIAGNOSIS — L02512 Cutaneous abscess of left hand: Secondary | ICD-10-CM | POA: Insufficient documentation

## 2016-10-19 DIAGNOSIS — F17210 Nicotine dependence, cigarettes, uncomplicated: Secondary | ICD-10-CM | POA: Insufficient documentation

## 2016-10-19 LAB — CBC
HEMATOCRIT: 42.3 % (ref 39.0–52.0)
HEMOGLOBIN: 14.3 g/dL (ref 13.0–17.0)
MCH: 29.9 pg (ref 26.0–34.0)
MCHC: 33.8 g/dL (ref 30.0–36.0)
MCV: 88.3 fL (ref 78.0–100.0)
PLATELETS: 243 10*3/uL (ref 150–400)
RBC: 4.79 MIL/uL (ref 4.22–5.81)
RDW: 12.8 % (ref 11.5–15.5)
WBC: 10.6 10*3/uL — AB (ref 4.0–10.5)

## 2016-10-19 LAB — COMPREHENSIVE METABOLIC PANEL
ALT: 43 U/L (ref 17–63)
ANION GAP: 8 (ref 5–15)
AST: 28 U/L (ref 15–41)
Albumin: 3.5 g/dL (ref 3.5–5.0)
Alkaline Phosphatase: 162 U/L — ABNORMAL HIGH (ref 38–126)
BUN: 9 mg/dL (ref 6–20)
CO2: 29 mmol/L (ref 22–32)
CREATININE: 0.76 mg/dL (ref 0.61–1.24)
Calcium: 8.9 mg/dL (ref 8.9–10.3)
Chloride: 100 mmol/L — ABNORMAL LOW (ref 101–111)
Glucose, Bld: 100 mg/dL — ABNORMAL HIGH (ref 65–99)
POTASSIUM: 3.7 mmol/L (ref 3.5–5.1)
SODIUM: 137 mmol/L (ref 135–145)
Total Bilirubin: 0.5 mg/dL (ref 0.3–1.2)
Total Protein: 7.1 g/dL (ref 6.5–8.1)

## 2016-10-19 MED ORDER — CLINDAMYCIN HCL 150 MG PO CAPS
300.0000 mg | ORAL_CAPSULE | Freq: Once | ORAL | Status: AC
  Administered 2016-10-19: 300 mg via ORAL
  Filled 2016-10-19: qty 2

## 2016-10-19 MED ORDER — LIDOCAINE-EPINEPHRINE (PF) 2 %-1:200000 IJ SOLN
10.0000 mL | Freq: Once | INTRAMUSCULAR | Status: AC
  Administered 2016-10-19: 10 mL
  Filled 2016-10-19: qty 20

## 2016-10-19 MED ORDER — CLINDAMYCIN HCL 150 MG PO CAPS: 150.0000 mg | ORAL_CAPSULE | Freq: Four times a day (QID) | ORAL | 0 refills | Status: DC

## 2016-10-19 NOTE — ED Notes (Signed)
Patient able to ambulate independently  
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
## 2016-10-19 NOTE — ED Triage Notes (Signed)
The pt is c/o a red swollen draining boil on his lt wrist and he reports that he has another on starting on his lt leg boils in the past

## 2016-10-22 ENCOUNTER — Telehealth: Payer: Self-pay
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
## 2016-10-22 NOTE — Telephone Encounter (Signed)
Message received from Michel BickersWandalyn Rogers, RN CM requesting [facility]. Informed her that there are no hospital follow up appointments now;but this CM will check with [facility] regarding availability at that clinic prior to contacting the patient.

## 2016-10-23 ENCOUNTER — Telehealth: Payer: Self-pay

## 2016-10-23 NOTE — Telephone Encounter (Addendum)
This CM spoke to Langston MaskerEsperanza Matias- Royce Macadamiaeodoro, [facility] scheduler who confirmed that hospital follow up appointments are available.   Call placed to the patient and informed him of the availability of [facility] and he was agreeable to scheduling an appointment. He said that he needed to see a doctor as he wanted to complete a disability application. He also explained that he was not able to afford the [facility]. He noted that he was told at the hospital that there would be no charge for the medication and he presented the pharmacy the documentation that he was provided in the ED.  He said that he has tried to contact the hospital to clarify the issue. Encouraged him to try to contact the hospital again.   Provided him with the contact # for [facility] in the event that the call was disconnected   and transferred the call the the clinic.  Update provided to Michel BickersWandalyn Rogers, RN CM

## 2016-10-26 ENCOUNTER — Encounter (INDEPENDENT_AMBULATORY_CARE_PROVIDER_SITE_OTHER): Payer: Self-pay | Admitting: Physician Assistant

## 2016-10-26 ENCOUNTER — Ambulatory Visit (INDEPENDENT_AMBULATORY_CARE_PROVIDER_SITE_OTHER): Payer: Self-pay | Admitting: Physician Assistant

## 2016-10-26 VITALS — BP 128/63 | HR 68 | Temp 97.4°F | Ht <= 58 in | Wt 171.8 lb

## 2016-10-26 DIAGNOSIS — L02419 Cutaneous abscess of limb, unspecified: Secondary | ICD-10-CM

## 2016-10-26 DIAGNOSIS — R748 Abnormal levels of other serum enzymes: Secondary | ICD-10-CM

## 2016-10-26 DIAGNOSIS — F418 Other specified anxiety disorders: Secondary | ICD-10-CM

## 2016-10-26 DIAGNOSIS — S73004A Unspecified dislocation of right hip, initial encounter: Secondary | ICD-10-CM

## 2016-10-26 DIAGNOSIS — R202 Paresthesia of skin: Secondary | ICD-10-CM

## 2016-10-26 DIAGNOSIS — R1013 Epigastric pain: Secondary | ICD-10-CM

## 2016-10-26 DIAGNOSIS — M79671 Pain in right foot: Secondary | ICD-10-CM

## 2016-10-26 MED ORDER — GABAPENTIN 300 MG PO CAPS: 300.0000 mg | ORAL_CAPSULE | Freq: Three times a day (TID) | ORAL | 3 refills | Status: DC

## 2016-10-26 MED ORDER — SERTRALINE HCL 25 MG PO TABS: 25.0000 mg | ORAL_TABLET | Freq: Every day | ORAL | 1 refills | Status: DC

## 2016-10-26 MED ORDER — OMEPRAZOLE 40 MG PO CPDR: 40.0000 mg | DELAYED_RELEASE_CAPSULE | Freq: Every day | ORAL | 3 refills | Status: DC
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
## 2016-10-26 NOTE — Patient Instructions (Signed)
Skin Abscess A skin abscess is an infected area on or under your skin that contains a collection of pus and other material. An abscess may also be called a furuncle, carbuncle, or boil. An abscess can occur in or on almost any part of your body. Some abscesses break open (rupture) on their own. Most continue to get worse unless they are treated. The infection can spread deeper into the body and eventually into your blood, which can make you feel ill. Treatment usually involves draining the abscess. What are the causes? An abscess occurs when germs, often bacteria, pass through your skin and cause an infection. This may be caused by:  A scrape or cut on your skin.  A puncture wound through your skin, including a needle injection.  Blocked oil or sweat glands.  Blocked and infected hair follicles.  A cyst that forms beneath your skin (sebaceous cyst) and becomes infected. What increases the risk? This condition is more likely to develop in people who:  Have a weak body defense system (immune system).  Have diabetes.  Have dry and irritated skin.  Get frequent injections or use illegal IV drugs.  Have a foreign body in a wound, such as a splinter.  Have problems with their lymph system or veins. What are the signs or symptoms? An abscess may start as a painful, firm bump under the skin. Over time, the abscess may get larger or become softer. Pus may appear at the top of the abscess, causing pressure and pain. It may eventually break through the skin and drain. Other symptoms include:  Redness.  Warmth.  Swelling.  Tenderness.  A sore on the skin. How is this diagnosed? This condition is diagnosed based on your medical history and a physical exam. A sample of pus may be taken from the abscess to find out what is causing the infection and what antibiotics can be used to treat it. You also may have:  Blood tests to look for signs of infection or spread of an infection to your  blood.  Imaging studies such as ultrasound, CT scan, or MRI if the abscess is deep. How is this treated? Small abscesses that drain on their own may not need treatment. Treatment for an abscess that does not rupture on its own may include:  Warm compresses applied to the area several times per day.  Incision and drainage. Your health care provider will make an incision to open the abscess and will remove pus and any foreign body or dead tissue. The incision area may be packed with gauze to keep it open for a few days while it heals.  Antibiotic medicines to treat infection. For a severe abscess, you may first get antibiotics through an IV and then change to oral antibiotics. Follow these instructions at home: Abscess Care   If you have an abscess that has not drained, place a warm, clean, wet washcloth over the abscess several times a day. Do this as told by your health care provider.  Follow instructions from your health care provider about how to take care of your abscess. Make sure you:  Cover the abscess with a bandage (dressing).  Change your dressing or gauze as told by your health care provider.  Wash your hands with soap and water before you change the dressing or gauze. If soap and water are not available, use hand sanitizer.  Check your abscess every day for signs of a worsening infection. Check for:  More redness, swelling, or   pain.  More fluid or blood.  Warmth.  More pus or a bad smell. Medicines   Take over-the-counter and prescription medicines only as told by your health care provider.  If you were prescribed an antibiotic medicine, take it as told by your health care provider. Do not stop taking the antibiotic even if you start to feel better. General instructions   To avoid spreading the infection:  Do not share personal care items, towels, or hot tubs with others.  Avoid making skin contact with other people.  Keep all follow-up visits as told by your  health care provider. This is important. Contact a health care provider if:  You have more redness, swelling, or pain around your abscess.  You have more fluid or blood coming from your abscess.  Your abscess feels warm to the touch.  You have more pus or a bad smell coming from your abscess.  You have a fever.  You have muscle aches.  You have chills or a general ill feeling. Get help right away if:  You have severe pain.  You see red streaks on your skin spreading away from the abscess. This information is not intended to replace advice given to you by your health care provider. Make sure you discuss any questions you have with your health care provider. Document Released: 05/09/2005 Document Revised: 03/25/2016 Document Reviewed: 06/08/2015 Elsevier Interactive Patient Education  2017 [employer] Major Depressive Disorder, Adult Major depressive disorder (MDD) is a mental health condition. It may also be called clinical depression or unipolar depression. MDD usually causes feelings of sadness, hopelessness, or helplessness. MDD can also cause physical symptoms. It can interfere with work, school, relationships, and other everyday activities. MDD may be mild, moderate, or severe. It may occur once (single episode major depressive disorder) or it may occur multiple times (recurrent major depressive disorder). What are the causes? The exact cause of this condition is not known. MDD is most likely caused by a combination of things, which may include:  Genetic factors. These are traits that are passed along from parent to child.  Individual factors. Your personality, your behavior, and the way you handle your thoughts and feelings may contribute to MDD. This includes personality traits and behaviors learned from others.  Physical factors, such as:  Differences in the part of your brain that controls emotion. This part of your brain may be different than it is in people who do not  have MDD.  Long-term (chronic) medical or psychiatric illnesses.  Social factors. Traumatic experiences or major life changes may play a role in the development of MDD. What increases the risk? This condition is more likely to develop in women. The following factors may also make you more likely to develop MDD:  A family history of depression.  Troubled family relationships.  Abnormally low levels of certain brain chemicals.  Traumatic events in childhood, especially abuse or the loss of a parent.  Being under a lot of stress, or long-term stress, especially from upsetting life experiences or losses.  A history of:  Chronic physical illness.  Other mental health disorders.  Substance abuse.  Poor living conditions.  Experiencing social exclusion or discrimination on a regular basis. What are the signs or symptoms? The main symptoms of MDD typically include:  Constant depressed or irritable mood.  Loss of interest in things and activities. MDD symptoms may also include:  Sleeping or eating too much or too little.  Unexplained weight change.  Fatigue or low  energy.  Feelings of worthlessness or guilt.  Difficulty thinking clearly or making decisions.  Thoughts of suicide or of harming others.  Physical agitation or weakness.  Isolation. Severe cases of MDD may also occur with other symptoms, such as:  Delusions or hallucinations, in which you imagine things that are not real (psychotic depression).  Low-level depression that lasts at least a year (chronic depression or persistent depressive disorder).  Extreme sadness and hopelessness (melancholic depression).  Trouble speaking and moving (catatonic depression). How is this diagnosed? This condition may be diagnosed based on:  Your symptoms.  Your medical history, including your mental health history. This may involve tests to evaluate your mental health. You may be asked questions about your lifestyle,  including any drug and alcohol use, and how long you have had symptoms of MDD.  A physical exam.  Blood tests to rule out other conditions. You must have a depressed mood and at least four other MDD symptoms most of the day, nearly every day in the same 2-week timeframe before your health care provider can confirm a diagnosis of MDD. How is this treated? This condition is usually treated by mental health professionals, such as psychologists, psychiatrists, and clinical social workers. You may need more than one type of treatment. Treatment may include:  Psychotherapy. This is also called talk therapy or counseling. Types of psychotherapy include:  Cognitive behavioral therapy (CBT). This type of therapy teaches you to recognize unhealthy feelings, thoughts, and behaviors, and replace them with positive thoughts and actions.  Interpersonal therapy (IPT). This helps you to improve the way you relate to and communicate with others.  Family therapy. This treatment includes members of your family.  Medicine to treat anxiety and depression, or to help you control certain emotions and behaviors.  Lifestyle changes, such as:  Limiting alcohol and drug use.  Exercising regularly.  Getting plenty of sleep.  Making healthy eating choices.  Spending more time outdoors. Treatments involving stimulation of the brain can be used in situations with extremely severe symptoms, or when medicine or other therapies do not work over time. These treatments include electroconvulsive therapy, transcranial magnetic stimulation, and vagal nerve stimulation. Follow these instructions at home: Activity   Return to your normal activities as told by your health care provider.  Exercise regularly and spend time outdoors as told by your health care provider. General instructions   Take over-the-counter and prescription medicines only as told by your health care provider.  Do not drink alcohol. If you drink  alcohol, limit your alcohol intake to no more than 1 drink a day for nonpregnant women and 2 drinks a day for men. One drink equals 12 oz of beer, 5 oz of wine, or 1 oz of hard liquor. Alcohol can affect any antidepressant medicines you are taking. Talk to your health care provider about your alcohol use.  Eat a healthy diet and get plenty of sleep.  Find activities that you enjoy doing, and make time to do them.  Consider joining a support group. Your health care provider may be able to recommend a support group.  Keep all follow-up visits as told by your health care provider. This is important. Where to find more information: [employer] on Mental Illness  www.nami.org [place] [employer] of Mental Health  http://www.maynard.net/ National Suicide Prevention Lifeline  1-800-273-TALK 848 599 0285). This is free, 24-hour help. Contact a health care provider if:  Your symptoms get worse.  You develop new symptoms. Get help right away  if:  You self-harm.  You have serious thoughts about hurting yourself or others.  You see, hear, taste, smell, or feel things that are not present (hallucinate). This information is not intended to replace advice given to you by your health care provider. Make sure you discuss any questions you have with your health care provider. Document Released: 11/24/2012 Document Revised: 04/05/2016 Document Reviewed: 02/08/2016 Elsevier Interactive Patient Education  2017 [employer].

## 2016-10-30 ENCOUNTER — Other Ambulatory Visit (INDEPENDENT_AMBULATORY_CARE_PROVIDER_SITE_OTHER): Payer: Self-pay | Admitting: Physician Assistant

## 2016-10-30 DIAGNOSIS — R1013 Epigastric pain: Secondary | ICD-10-CM

## 2016-10-30 NOTE — Progress Notes (Signed)
Had to reorder H pylori for reason unknown. I have CMA calling lab to inquire about why test has not been run.

## 2016-11-05 ENCOUNTER — Encounter (HOSPITAL_COMMUNITY): Payer: Self-pay

## 2016-11-05 ENCOUNTER — Emergency Department (HOSPITAL_COMMUNITY): Payer: Self-pay

## 2016-11-05 ENCOUNTER — Emergency Department (HOSPITAL_COMMUNITY)
Admission: EM | Admit: 2016-11-05 | Discharge: 2016-11-05 | Disposition: A | Discharge status: Home / Self Care | Payer: Self-pay | Attending: Emergency Medicine | Admitting: Emergency Medicine

## 2016-11-05 DIAGNOSIS — Y999 Unspecified external cause status: Secondary | ICD-10-CM | POA: Insufficient documentation

## 2016-11-05 DIAGNOSIS — Z79899 Other long term (current) drug therapy: Secondary | ICD-10-CM | POA: Insufficient documentation

## 2016-11-05 DIAGNOSIS — Y929 Unspecified place or not applicable: Secondary | ICD-10-CM | POA: Insufficient documentation

## 2016-11-05 DIAGNOSIS — F17210 Nicotine dependence, cigarettes, uncomplicated: Secondary | ICD-10-CM | POA: Insufficient documentation

## 2016-11-05 DIAGNOSIS — Z9889 Other specified postprocedural states: Secondary | ICD-10-CM

## 2016-11-05 DIAGNOSIS — S73004A Unspecified dislocation of right hip, initial encounter: Secondary | ICD-10-CM | POA: Insufficient documentation

## 2016-11-05 DIAGNOSIS — Y9389 Activity, other specified: Secondary | ICD-10-CM | POA: Insufficient documentation

## 2016-11-05 DIAGNOSIS — X509XXA Other and unspecified overexertion or strenuous movements or postures, initial encounter: Secondary | ICD-10-CM | POA: Insufficient documentation

## 2016-11-05 MED ORDER — PROPOFOL 10 MG/ML IV BOLUS
INTRAVENOUS | Status: AC | PRN
  Administered 2016-11-05: 25 mg via INTRAVENOUS

## 2016-11-05 MED ORDER — PROPOFOL 10 MG/ML IV BOLUS
INTRAVENOUS | Status: AC | PRN
  Administered 2016-11-05: 12.5 mg via INTRAVENOUS

## 2016-11-05 MED ORDER — KETAMINE HCL-SODIUM CHLORIDE 100-0.9 MG/10ML-% IV SOSY
1.0000 mg/kg | PREFILLED_SYRINGE | Freq: Once | INTRAVENOUS | Status: DC
  Filled 2016-11-05: qty 10

## 2016-11-05 MED ORDER — KETAMINE HCL 10 MG/ML IJ SOLN
INTRAMUSCULAR | Status: AC | PRN
  Administered 2016-11-05: 12.5 mg via INTRAVENOUS

## 2016-11-05 MED ORDER — KETAMINE HCL 10 MG/ML IJ SOLN
INTRAMUSCULAR | Status: AC | PRN
  Administered 2016-11-05: 25 mg via INTRAVENOUS

## 2016-11-05 MED ORDER — PROPOFOL 10 MG/ML IV BOLUS
0.5000 mg/kg | Freq: Once | INTRAVENOUS | Status: DC
  Filled 2016-11-05: qty 20

## 2016-11-05 NOTE — ED Triage Notes (Signed)
Pt reports he had a right hip replacement in 2001 and since then has had it dislocated a few times, last it was dislocated was 3-4 days ago but "popped back in" on its own. He feels it is dislocated at this time and states he is unable to "pop it back in" on his own this time. Ambulatory with crutches.

## 2016-11-05 NOTE — Discharge Instructions (Signed)
Please read and follow all provided instructions.  Your diagnoses today include:  1. Dislocation of right hip, initial encounter (HCC)   2. 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 See below for your results today.   Medications prescribed:  Take as prescribed   Home care instructions:  Follow any educational materials contained in this packet.  Follow-up instructions: Please follow-up with your Orthopedic provider for further evaluation of symptoms and treatment   Return instructions:  Please return to the Emergency Department if you do not get better, if you get worse, or new symptoms OR  - Fever (temperature greater than 101.53F)  - Bleeding that does not stop with holding pressure to the area    -Severe pain (please note that you may be more sore the day after your accident)  - Chest Pain  - Difficulty breathing  - Severe nausea or vomiting  - Inability to tolerate food and liquids  - Passing out  - Skin becoming red around your wounds  - Change in mental status (confusion or lethargy)  - New numbness or weakness    Please return if you have any other emergent concerns.  Additional Information:  Your vital signs today were: BP 132/71    Pulse 85    Temp 98.3 F (36.8 C) (Oral)    Resp (!) 22    SpO2 100%  If your blood pressure (BP) was elevated above 135/85 this visit, please have this repeated by your doctor within one month. ---------------

## 2016-11-05 NOTE — Progress Notes (Signed)
Orthopedic Tech Progress Note Patient Details:  Sharion DoveRoger D Bingaman [birth year] [id number]  Ortho Devices Type of Ortho Device: Knee Immobilizer Ortho Device/Splint Location: RLE Ortho Device/Splint Interventions: Ordered, Application   Jennye MoccasinHughes, Laveah Gloster Craig 11/05/2016, 4:49 PM

## 2016-11-05 NOTE — ED Notes (Signed)
Pt stable, ambulatory, states understanding of discharge instructions 

## 2016-11-05 NOTE — ED Provider Notes (Signed)
Face-to-face evaluation   History: He complains of right hip pain, for 1 week and feels like it has been out of socket, for 1 week, except for a 4 hour period when he "feels like to get back in," and it came out again.  He does not currently have a orthopedist in [place].  Patient replacement, 2001.  Physical exam: Alert, cooperative.  Heart regular rate and rhythm no murmur lungs clear anteriorly.  Teeth appear intact without visible fracture, or loose teeth.  .Sedation Date/Time: 11/05/2016 3:44 PM Performed by: Bradley Mcgrath Authorized by: Bradley Mcgrath   Consent:    Consent obtained:  Verbal   Consent given by:  Patient   Alternatives discussed:  Analgesia without sedation Indications:    Procedure performed:  Dislocation reduction   Procedure necessitating sedation performed by:  Physician performing sedation   Intended level of sedation:  Deep Pre-sedation assessment:    Time since last food or drink:  4 hours   ASA classification: class 1 - normal, healthy patient     Neck mobility: normal     Mouth opening:  3 or more finger widths   Mallampati score:  II - soft palate, uvula, fauces visible   Pre-sedation assessments completed and reviewed: airway patency, cardiovascular function, hydration status and mental status     Pre-sedation assessments completed and reviewed: nausea/vomiting not reviewed     Pre-sedation assessment completed:  11/05/2016 4:00 PM Immediate pre-procedure details:    Reassessment: Patient reassessed immediately prior to procedure     Reviewed: vital signs     Verified: bag valve mask available, emergency equipment available, intubation equipment available, IV patency confirmed, oxygen available and suction available   Procedure details (see MAR for exact dosages):    Sedation start time:  11/05/2016 4:36 PM   Preoxygenation:  Nasal cannula   Sedation:  Ketamine (and propofol)   Intra-procedure monitoring:  Blood pressure monitoring, continuous  capnometry, continuous pulse oximetry, cardiac monitor, frequent vital sign checks and frequent LOC assessments   Intra-procedure events: none     Sedation end time:  11/05/2016 4:54 PM   Total sedation time (minutes):  18 Post-procedure details:    Post-sedation assessment completed:  11/05/2016 4:57 PM   Attendance: Constant attendance by certified staff until patient recovered     Recovery: Patient returned to pre-procedure baseline     Post-sedation assessments completed and reviewed: airway patency, cardiovascular function, hydration status and mental status     Post-sedation assessments completed and reviewed: nausea/vomiting not reviewed     Patient is stable for discharge or admission: yes     Patient tolerance:  Tolerated well, no immediate complications Reduction of dislocation Date/Time: 11/05/2016 3:44 PM Performed by: Bradley Mcgrath Authorized by: Effie Shy, Brenda Samano  Risks and benefits: risks, benefits and alternatives were discussed Consent given by: patient Time out: Immediately prior to procedure a "time out" was called to verify the correct patient, procedure, equipment, support staff and site/side marked as required. Local anesthesia used: no  Anesthesia: Local anesthesia used: no  Sedation: Patient sedated: yes Sedation type: moderate (conscious) sedation Sedatives: propofol (and Ketamine) Sedation start date/time: 11/05/2016 4:36 PM Sedation end date/time: 11/05/2016 4:54 PM Vitals: Vital signs were monitored during sedation. Patient tolerance: Patient tolerated the procedure well with no immediate complications Comments: Multiple attempts done in conjunction with Bradley Mcgrath; hip flexion with internal rotation, external rotation, lengthening and anterior mobilization. Leg length restored.      Medical screening examination/treatment/procedure(s) were conducted as a shared  visit with non-physician practitioner(s) and myself.  I personally evaluated the patient during the  encounter   Bradley BaleElliott Akshara Blumenthal, MD 11/06/16 1003
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
## 2016-11-06 ENCOUNTER — Other Ambulatory Visit (INDEPENDENT_AMBULATORY_CARE_PROVIDER_SITE_OTHER): Payer: Self-pay | Admitting: Physician Assistant

## 2016-11-06 ENCOUNTER — Other Ambulatory Visit (INDEPENDENT_AMBULATORY_CARE_PROVIDER_SITE_OTHER): Payer: Self-pay

## 2016-11-06 DIAGNOSIS — R1013 Epigastric pain: Secondary | ICD-10-CM

## 2016-11-06 DIAGNOSIS — F418 Other specified anxiety disorders: Secondary | ICD-10-CM

## 2016-11-06 DIAGNOSIS — R748 Abnormal levels of other serum enzymes: Secondary | ICD-10-CM

## 2016-11-06 DIAGNOSIS — R202 Paresthesia of skin: Secondary | ICD-10-CM

## 2016-11-06 LAB — BENZODIAZEPINES,MS,WB/SP RFX
7-Aminoclonazepam: NEGATIVE ng/mL
Alprazolam: NEGATIVE ng/mL
BENZODIAZEPINES CONFIRM: POSITIVE
Chlordiazepoxide: NEGATIVE ng/mL
Clonazepam: 4 ng/mL
DESMETHYLCHLORDIAZEPOXIDE: NEGATIVE ng/mL
DESMETHYLDIAZEPAM: 26 ng/mL
DIAZEPAM: 14 ng/mL
Desalkylflurazepam: NEGATIVE ng/mL
Flurazepam: NEGATIVE ng/mL
LORAZEPAM: NEGATIVE ng/mL
Midazolam: NEGATIVE ng/mL
OXAZEPAM: NEGATIVE ng/mL
Temazepam: NEGATIVE ng/mL
Triazolam: NEGATIVE ng/mL

## 2016-11-06 LAB — THC,MS,WB/SP RFX
CARBOXY-THC: 15.3 ng/mL
Cannabidiol: NEGATIVE ng/mL
Cannabinoid Confirmation: POSITIVE
Cannabinol: NEGATIVE ng/mL
Hydroxy-THC: NEGATIVE ng/mL
Tetrahydrocannabinol(THC): NEGATIVE ng/mL

## 2016-11-06 LAB — DRUG SCREEN 8 W/CONF, WB
Amphetamines, IA: NEGATIVE ng/mL
BENZODIAZEPINES, IA: POSITIVE ng/mL — AB
Barbiturates, IA: NEGATIVE ug/mL
Cocaine/Metabolite, IA: NEGATIVE ng/mL
OPIATES, IA: NEGATIVE ng/mL
Oxycodones, IA: NEGATIVE ng/mL
Phencyclidine, IA: NEGATIVE ng/mL
THC (MARIJUANA) MTB,IA: POSITIVE ng/mL — AB

## 2016-11-06 LAB — GAMMA GT

## 2016-11-06 LAB — H. PYLORI ANTIBODY, IGG

## 2016-11-06 MED ORDER — GABAPENTIN 300 MG PO CAPS: 300.0000 mg | ORAL_CAPSULE | Freq: Three times a day (TID) | ORAL | 3 refills | Status: DC

## 2016-11-06 MED ORDER — OMEPRAZOLE 40 MG PO CPDR: 40.0000 mg | DELAYED_RELEASE_CAPSULE | Freq: Every day | ORAL | 3 refills | Status: DC

## 2016-11-06 MED ORDER — SERTRALINE HCL 25 MG PO TABS: 25.0000 mg | ORAL_TABLET | Freq: Every day | ORAL | 1 refills | Status: DC
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
## 2016-11-07 LAB — GAMMA GT: GGT: 103 IU/L — AB (ref 0–65)

## 2016-11-08 LAB — PLEASE NOTE

## 2016-11-08 LAB — H. PYLORI ANTIBODY, IGG: H. pylori, IgG AbS: <0.8 Index Value (ref 0.00–0.79)

## 2016-11-13 ENCOUNTER — Ambulatory Visit (INDEPENDENT_AMBULATORY_CARE_PROVIDER_SITE_OTHER): Payer: Self-pay | Admitting: Physician Assistant

## 2016-11-14 ENCOUNTER — Ambulatory Visit (INDEPENDENT_AMBULATORY_CARE_PROVIDER_SITE_OTHER): Payer: Self-pay | Admitting: Physician Assistant

## 2016-11-14 ENCOUNTER — Encounter (INDEPENDENT_AMBULATORY_CARE_PROVIDER_SITE_OTHER): Payer: Self-pay | Admitting: Physician Assistant

## 2016-11-14 VITALS — BP 148/95 | HR 107 | Temp 98.0°F | Ht 66.0 in | Wt 169.2 lb

## 2016-11-14 DIAGNOSIS — R748 Abnormal levels of other serum enzymes: Secondary | ICD-10-CM

## 2016-11-14 DIAGNOSIS — F1290 Cannabis use, unspecified, uncomplicated: Secondary | ICD-10-CM

## 2016-11-14 DIAGNOSIS — F1390 Sedative, hypnotic, or anxiolytic use, unspecified, uncomplicated: Secondary | ICD-10-CM

## 2016-11-14 DIAGNOSIS — F1310 Sedative, hypnotic or anxiolytic abuse, uncomplicated: Secondary | ICD-10-CM

## 2016-11-14 DIAGNOSIS — F411 Generalized anxiety disorder: Secondary | ICD-10-CM

## 2016-11-14 DIAGNOSIS — R1011 Right upper quadrant pain: Secondary | ICD-10-CM

## 2016-11-14 MED ORDER — SERTRALINE HCL 100 MG PO TABS: 100.0000 mg | ORAL_TABLET | Freq: Every day | ORAL | 3 refills | Status: DC

## 2016-11-14 NOTE — Patient Instructions (Signed)

## 2016-11-14 NOTE — Progress Notes (Signed)
Subjective:  Patient ID: Bradley Mcgrath, male    DOB: 1981/06/16  Age: 36 y.o. MRN: [id number]  CC: f/u abdominal pain  HPI Bradley Mcgrath is a 36 y.o. male with a PMH Anxiety, drug use, and right hip arthroplasty presents for RUQ abdominal pain. Was seen here recently and was found to have elevated ALP and GGT. He also had an abdominal US on 08/2014 which was suspicious for choledocholithiasis. Patient did not pursue cholecystectomy due to financial reasons. Patient endorses mild nausea without vomiting. Denies fever or chills. Says he has been anxious ever since he was told his liver enzymes are elevated. He says he occasionally buys Xanax from people to calm his anxiety. Also reports smoking marijuana to help with his right hip pain. Does not endorse any other symptom.  ROS Review of Systems  Constitutional: Negative for chills, fever and malaise/fatigue.  Eyes: Negative for blurred vision.  Respiratory: Negative for shortness of breath.   Cardiovascular: Negative for chest pain and palpitations.  Gastrointestinal: Positive for abdominal pain. Negative for blood in stool, constipation, diarrhea, melena, nausea and vomiting.  Genitourinary: Negative for dysuria and hematuria.  Musculoskeletal: Negative for joint pain and myalgias.  Skin: Negative for rash.  Neurological: Negative for tingling and headaches.  Psychiatric/Behavioral: Negative for depression. The patient is nervous/anxious.     Objective:  BP (!) 148/95 (BP Location: Right Arm, Patient Position: Sitting, Cuff Size: Normal)   Pulse (!) 107   Temp 98 F (36.7 C) (Oral)   Ht  (1.676 m)   Wt 169 lb 3.2 oz (76.7 kg)   SpO2 99%   BMI 27.31 kg/m   BP/Weight 11/14/2016 11/05/2016 10/26/2016  Systolic BP 148 118 128  Diastolic BP 95 72 63  Wt. (Lbs) 169.2 - 171.8  BMI 27.31 - 3355.24      Physical Exam  Constitutional: He is oriented to person, place, and time.  Well developed, somewhat thin, NAD  HENT:  Head:  Normocephalic and atraumatic.  Eyes: No scleral icterus.  Neck: Normal range of motion. Neck supple. No thyromegaly present.  Cardiovascular: Normal rate, regular rhythm and normal heart sounds.   Pulmonary/Chest: Effort normal and breath sounds normal.  Abdominal: Soft. Bowel sounds are normal. There is tenderness (RUQ and epigastric TTP).  Musculoskeletal: He exhibits no edema.  Neurological: He is alert and oriented to person, place, and time.  Skin: Skin is warm and dry. No rash noted. No erythema. No pallor.  Psychiatric: He has a normal mood and affect. Thought content normal.  Wide eyed, somewhat rapid speech without much pause, goes into next train of thought rapidly. Non-tangential. Poor insight, poor judgement.   Vitals reviewed.    Assessment & Plan:   1. Right upper quadrant abdominal pain - Previous imaging in January 2016 concerning for choledocholithiasis.  - Hepatitis panel, acute - Mitochondrial antibodies - ANA - Lipase - CBC with Differential/Platelet - Comprehensive metabolic panel - US Abdomen Limited RUQ; Future  2. Abnormal GGT test - Hepatitis panel, acute - Mitochondrial antibodies - ANA - Lipase - CBC with Differential/Platelet - Comprehensive metabolic panel - US Abdomen Limited RUQ; Future  3. Generalized anxiety disorder - Increase sertraline (ZOLOFT) 100 MG tablet; Take 1 tablet (100 mg total) by mouth daily.  Dispense: 30 tablet; Refill: 3. Take half tablet for the first two weeks.  4. Marijuana use - Stop using marijuana as this is illegal and may also worsen your paranoia.   5. Benzodiazepine misuse - Stop  taking benzodiazepines when not prescribed to you.   Meds ordered this encounter  Medications  . sertraline (ZOLOFT) 100 MG tablet    Sig: Take 1 tablet (100 mg total) by mouth daily.    Dispense:  30 tablet    Refill:  3    Order Specific Question:   Supervising Provider    Answer:   Quentin Angst [id number]     Follow-up: Return in about 4 weeks (around 12/12/2016) for depression with anxiety.   Loletta Specter PA

## 2016-11-14 NOTE — Progress Notes (Signed)
Pt presents for a F/U about his liver  Pt wanted to know if it is ok for him to begin the pain medication and abx with the liver problems  Pt complains of abdominal pain- 7

## 2016-11-15 LAB — CBC WITH DIFFERENTIAL/PLATELET
BASOS ABS: 0 10*3/uL (ref 0.0–0.2)
Basos: 0 %
EOS (ABSOLUTE): 0.1 10*3/uL (ref 0.0–0.4)
Eos: 1 %
Hematocrit: 39.8 % (ref 37.5–51.0)
Hemoglobin: 13.9 g/dL (ref 13.0–17.7)
IMMATURE GRANS (ABS): 0 10*3/uL (ref 0.0–0.1)
Immature Granulocytes: 0 %
LYMPHS ABS: 1.3 10*3/uL (ref 0.7–3.1)
LYMPHS: 22 %
MCH: 30 pg (ref 26.6–33.0)
MCHC: 34.9 g/dL (ref 31.5–35.7)
MCV: 86 fL (ref 79–97)
Monocytes Absolute: 0.3 10*3/uL (ref 0.1–0.9)
Monocytes: 6 %
NEUTROS ABS: 4 10*3/uL (ref 1.4–7.0)
Neutrophils: 71 %
PLATELETS: 273 10*3/uL (ref 150–379)
RBC: 4.63 x10E6/uL (ref 4.14–5.80)
RDW: 13.6 % (ref 12.3–15.4)
WBC: 5.7 10*3/uL (ref 3.4–10.8)

## 2016-11-15 LAB — COMPREHENSIVE METABOLIC PANEL
ALT: 56 IU/L — ABNORMAL HIGH (ref 0–44)
AST: 32 IU/L (ref 0–40)
Albumin/Globulin Ratio: 1.5 (ref 1.2–2.2)
Albumin: 4.1 g/dL (ref 3.5–5.5)
Alkaline Phosphatase: 236 IU/L — ABNORMAL HIGH (ref 39–117)
BUN / CREAT RATIO: 13 (ref 9–20)
BUN: 9 mg/dL (ref 6–20)
CHLORIDE: 99 mmol/L (ref 96–106)
CO2: 24 mmol/L (ref 18–29)
Calcium: 9.1 mg/dL (ref 8.7–10.2)
Creatinine, Ser: 0.69 mg/dL — ABNORMAL LOW (ref 0.76–1.27)
GFR calc non Af Amer: 123 mL/min/{1.73_m2} (ref >59)
GFR, EST AFRICAN AMERICAN: 142 mL/min/{1.73_m2} (ref >59)
GLUCOSE: 90 mg/dL (ref 65–99)
Globulin, Total: 2.8 g/dL (ref 1.5–4.5)
POTASSIUM: 4.1 mmol/L (ref 3.5–5.2)
Sodium: 140 mmol/L (ref 134–144)
Total Protein: 6.9 g/dL (ref 6.0–8.5)

## 2016-11-15 LAB — MITOCHONDRIAL ANTIBODIES: Mitochondrial Ab: 20.5 Units — ABNORMAL HIGH (ref 0.0–20.0)

## 2016-11-15 LAB — HEPATITIS PANEL, ACUTE
HEP B C IGM: NEGATIVE
Hep A IgM: NEGATIVE
Hep C Virus Ab: <0.1 s/co ratio (ref 0.0–0.9)
Hepatitis B Surface Ag: NEGATIVE

## 2016-11-15 LAB — LIPASE: LIPASE: 106 U/L — AB (ref 13–78)

## 2016-11-15 LAB — ANA: ANA: NEGATIVE

## 2016-11-16 ENCOUNTER — Ambulatory Visit (INDEPENDENT_AMBULATORY_CARE_PROVIDER_SITE_OTHER): Payer: Self-pay

## 2016-11-20 ENCOUNTER — Ambulatory Visit (INDEPENDENT_AMBULATORY_CARE_PROVIDER_SITE_OTHER): Payer: Self-pay | Admitting: Orthopaedic Surgery

## 2016-11-21 ENCOUNTER — Ambulatory Visit (HOSPITAL_COMMUNITY): Payer: Self-pay

## 2016-11-30 ENCOUNTER — Ambulatory Visit (HOSPITAL_COMMUNITY)
Admission: RE | Admit: 2016-11-30 | Discharge: 2016-11-30 | Disposition: A | Discharge status: Home / Self Care | Payer: Self-pay | Source: Ambulatory Visit | Attending: Physician Assistant | Admitting: Physician Assistant

## 2016-11-30 ENCOUNTER — Other Ambulatory Visit (INDEPENDENT_AMBULATORY_CARE_PROVIDER_SITE_OTHER): Payer: Self-pay | Admitting: Physician Assistant

## 2016-11-30 DIAGNOSIS — R1011 Right upper quadrant pain: Secondary | ICD-10-CM

## 2016-11-30 DIAGNOSIS — K8020 Calculus of gallbladder without cholecystitis without obstruction: Secondary | ICD-10-CM | POA: Insufficient documentation

## 2016-11-30 DIAGNOSIS — R748 Abnormal levels of other serum enzymes: Secondary | ICD-10-CM

## 2016-12-04 ENCOUNTER — Telehealth (INDEPENDENT_AMBULATORY_CARE_PROVIDER_SITE_OTHER): Payer: Self-pay | Admitting: Physician Assistant

## 2016-12-06 ENCOUNTER — Encounter (INDEPENDENT_AMBULATORY_CARE_PROVIDER_SITE_OTHER): Payer: Self-pay

## 2016-12-07 ENCOUNTER — Ambulatory Visit (INDEPENDENT_AMBULATORY_CARE_PROVIDER_SITE_OTHER): Payer: Self-pay

## 2016-12-14 ENCOUNTER — Ambulatory Visit (INDEPENDENT_AMBULATORY_CARE_PROVIDER_SITE_OTHER): Payer: Self-pay | Admitting: Physician Assistant

## 2016-12-24 ENCOUNTER — Encounter (HOSPITAL_COMMUNITY): Payer: Self-pay

## 2016-12-24 ENCOUNTER — Emergency Department (HOSPITAL_COMMUNITY)
Admission: EM | Admit: 2016-12-24 | Discharge: 2016-12-24 | Disposition: A | Discharge status: Home / Self Care | Payer: Self-pay | Attending: Emergency Medicine | Admitting: Emergency Medicine

## 2016-12-24 DIAGNOSIS — Z5321 Procedure and treatment not carried out due to patient leaving prior to being seen by health care provider: Secondary | ICD-10-CM | POA: Insufficient documentation

## 2016-12-24 DIAGNOSIS — M25551 Pain in right hip: Secondary | ICD-10-CM | POA: Insufficient documentation

## 2016-12-24 NOTE — ED Notes (Signed)
Refused x-ray

## 2016-12-24 NOTE — ED Triage Notes (Signed)
Pt complaining of R hip pain. Pt states hx of frequent of hip dislocations. Pt denies any fall or trauma tonight. Pt states turning, felt hip pop out.

## 2016-12-24 NOTE — ED Notes (Signed)
Pt left to "smoke" when he refused xray. Has not returned.

## 2016-12-28 ENCOUNTER — Ambulatory Visit (HOSPITAL_COMMUNITY)
Admission: EM | Admit: 2016-12-28 | Discharge: 2016-12-28 | Payer: Self-pay | Attending: Emergency Medicine | Admitting: Emergency Medicine

## 2016-12-28 ENCOUNTER — Emergency Department (HOSPITAL_COMMUNITY): Payer: Self-pay

## 2016-12-28 ENCOUNTER — Encounter (HOSPITAL_COMMUNITY): Admission: EM | Payer: Self-pay | Source: Home / Self Care | Attending: Emergency Medicine

## 2016-12-28 ENCOUNTER — Emergency Department (HOSPITAL_COMMUNITY): Payer: Self-pay | Admitting: Anesthesiology

## 2016-12-28 ENCOUNTER — Encounter (HOSPITAL_COMMUNITY): Payer: Self-pay | Admitting: Emergency Medicine

## 2016-12-28 DIAGNOSIS — Z7982 Long term (current) use of aspirin: Secondary | ICD-10-CM | POA: Insufficient documentation

## 2016-12-28 DIAGNOSIS — M25559 Pain in unspecified hip: Secondary | ICD-10-CM

## 2016-12-28 DIAGNOSIS — Z419 Encounter for procedure for purposes other than remedying health state, unspecified: Secondary | ICD-10-CM

## 2016-12-28 DIAGNOSIS — T84020A Dislocation of internal right hip prosthesis, initial encounter: Secondary | ICD-10-CM | POA: Insufficient documentation

## 2016-12-28 DIAGNOSIS — F17210 Nicotine dependence, cigarettes, uncomplicated: Secondary | ICD-10-CM | POA: Insufficient documentation

## 2016-12-28 DIAGNOSIS — S73004A Unspecified dislocation of right hip, initial encounter: Secondary | ICD-10-CM

## 2016-12-28 DIAGNOSIS — Z79899 Other long term (current) drug therapy: Secondary | ICD-10-CM | POA: Insufficient documentation

## 2016-12-28 DIAGNOSIS — X500XXA Overexertion from strenuous movement or load, initial encounter: Secondary | ICD-10-CM | POA: Insufficient documentation

## 2016-12-28 HISTORY — PX: HIP CLOSED REDUCTION: SHX983

## 2016-12-28 SURGERY — CLOSED REDUCTION, HIP
Anesthesia: General | Site: Hip | Laterality: Right

## 2016-12-28 MED ORDER — ETOMIDATE 2 MG/ML IV SOLN
10.0000 mg | Freq: Once | INTRAVENOUS | Status: AC
  Administered 2016-12-28: 10 mg via INTRAVENOUS

## 2016-12-28 MED ORDER — PROPOFOL 10 MG/ML IV BOLUS
2.0000 mg/kg | Freq: Once | INTRAVENOUS | Status: AC
  Administered 2016-12-28: 200 mg via INTRAVENOUS
  Filled 2016-12-28: qty 20

## 2016-12-28 MED ORDER — SUCCINYLCHOLINE CHLORIDE 20 MG/ML IJ SOLN
INTRAMUSCULAR | Status: DC | PRN
  Administered 2016-12-28: 20 mg via INTRAVENOUS
  Administered 2016-12-28: 100 mg via INTRAVENOUS

## 2016-12-28 MED ORDER — LACTATED RINGERS IV SOLN
INTRAVENOUS | Status: DC | PRN
  Administered 2016-12-28: 20:00:00 via INTRAVENOUS

## 2016-12-28 MED ORDER — FENTANYL CITRATE (PF) 100 MCG/2ML IJ SOLN
100.0000 ug | Freq: Once | INTRAMUSCULAR | Status: AC
  Administered 2016-12-28: 100 ug via INTRAVENOUS
  Filled 2016-12-28: qty 2

## 2016-12-28 MED ORDER — FENTANYL CITRATE (PF) 100 MCG/2ML IJ SOLN
50.0000 ug | Freq: Once | INTRAMUSCULAR | Status: AC
  Administered 2016-12-28: 50 ug via INTRAVENOUS
  Filled 2016-12-28: qty 2

## 2016-12-28 MED ORDER — PROPOFOL 10 MG/ML IV BOLUS
INTRAVENOUS | Status: AC | PRN
  Administered 2016-12-28 (×4): 35 mg via INTRAVENOUS

## 2016-12-28 MED ORDER — MEPERIDINE HCL 25 MG/ML IJ SOLN: 6.2500 mg | INTRAMUSCULAR | Status: DC | PRN

## 2016-12-28 MED ORDER — LIDOCAINE HCL (CARDIAC) 20 MG/ML IV SOLN
INTRAVENOUS | Status: DC | PRN
  Administered 2016-12-28: 100 mg via INTRATRACHEAL

## 2016-12-28 MED ORDER — PROMETHAZINE HCL 25 MG/ML IJ SOLN: 6.2500 mg | INTRAMUSCULAR | Status: DC | PRN

## 2016-12-28 MED ORDER — HYDROMORPHONE HCL 1 MG/ML IJ SOLN
0.2500 mg | INTRAMUSCULAR | Status: DC | PRN
Start: 2016-12-28 — End: 2016-12-29

## 2016-12-28 MED ORDER — PROPOFOL 10 MG/ML IV BOLUS
INTRAVENOUS | Status: AC
  Filled 2016-12-28: qty 20

## 2016-12-28 MED ORDER — KETOROLAC TROMETHAMINE 30 MG/ML IJ SOLN
INTRAMUSCULAR | Status: AC
  Filled 2016-12-28: qty 1

## 2016-12-28 MED ORDER — ETOMIDATE 2 MG/ML IV SOLN
INTRAVENOUS | Status: DC | PRN
  Administered 2016-12-28: 10 mg via INTRAVENOUS

## 2016-12-28 MED ORDER — ETOMIDATE 2 MG/ML IV SOLN
INTRAVENOUS | Status: AC
  Filled 2016-12-28: qty 10

## 2016-12-28 MED ORDER — KETOROLAC TROMETHAMINE 10 MG PO TABS: 10.0000 mg | ORAL_TABLET | Freq: Four times a day (QID) | ORAL | 0 refills | Status: DC | PRN

## 2016-12-28 MED ORDER — KETOROLAC TROMETHAMINE 30 MG/ML IJ SOLN
30.0000 mg | Freq: Once | INTRAMUSCULAR | Status: DC | PRN
  Administered 2016-12-28: 30 mg via INTRAVENOUS

## 2016-12-28 SURGICAL SUPPLY — 2 items
IMMOBILIZER KNEE 24 THIGH 36 (MISCELLANEOUS) IMPLANT
IMMOBILIZER KNEE 24 UNIV (MISCELLANEOUS) ×3

## 2016-12-28 NOTE — Op Note (Signed)
NAME:  Bradley Mcgrath, Bradley Mcgrath                  ACCOUNT NO.:  [id number]  MEDICAL RECORD NO.:  [id number]  LOCATION:  [facility]                         FACILITY:  [facility]  PHYSICIAN:  Doralee AlbinoMichael H. Carola FrostHandy, M.D. DATE OF BIRTH:  08/25/1980  DATE OF PROCEDURE:  12/28/2016 DATE OF DISCHARGE:                              OPERATIVE REPORT   PREOPERATIVE DIAGNOSIS:  Right total hip arthroplasty, dislocation.  POSTOPERATIVE DIAGNOSIS:  Right total hip arthroplasty, dislocation.  PROCEDURES: 1. Closed reduction of right hip under general anesthesia. 2. Stress evaluation of the right total hip implant under fluoroscopy.  SURGEON:  Doralee AlbinoMichael H. Carola FrostHandy, M.D.  ASSISTANT:  Mearl LatinKeith W Paul, [place]PA  ANESTHESIA:  General.  COMPLICATIONS:  None.  DISPOSITION:  To PACU.  CONDITION:  Stable.  BRIEF SUMMARY AND INDICATIONS FOR PROCEDURE:  Bradley Mcgrath is a 36 year old incarcerated patient who reports acute dislocation of the right hip 4 days ago, when lifting a stove.  He presented to the emergency department today and underwent 2 attempts at relocation, one using propofol and the other etomidate without success.  He consequently warranted evaluation by Orthopedics and I have recommended proceeding to the OR for closed reduction and evaluation of the hip components under fluoro, given that he has had 2 other dislocations in the past 12 months.  I discussed with him the risk and benefits of the procedure including the possibility of fracture, re-dislocation, failure of relocation, and others, and he strongly wished to proceed.  BRIEF SUMMARY OF PROCEDURE:  The patient was taken to the operating room where general anesthesia was induced.  Complete muscle relaxation was obtained with succinylcholine.  A sustained flexion and adduction maneuver was performed.  This allowed the patient's femoral head to gently and progressively move anteriorly and relocate.  I was then able to extend the hip and abducted and the patient had  surprisingly good stability.  I flexed up to 90 degrees with 10 degrees of adduction and did not note any subluxation.  In 45 degrees of flexion and internal rotation, I did not notice any movement and with adduction without internal rotation, I did not notice any either.  I also went into abduction and external rotation, did not note any anterior instability. The components did not appear to be loose or mobile.  The patient was placed into a knee immobilizer and awakened from anesthesia and taken to the PACU in stable condition.  Montez MoritaKeith Paul, PA-C, assisted me throughout, and actually was able to generate the force to produce the reduction while I maintained stability of the pelvis.  PROGNOSIS:  Brad[facility]  All followup should be directed to those surgeons.  I do not think the patient would but necessarily benefit from revision at this time, and I was surprised that the stability identified on the table today as noted above.  Because of the 4 days of a dislocation and likely be on touchdown weightbearing with crutches for the next 2 weeks, he has a knee immobilizer, in which he has to sleep.  We have reviewed his hip precautions and these are paramount.  At this  time, we anticipate discharge back to his facility where he has a single room according to the officer with him and certainly may be contacted with questions.     Doralee Albino. Carola Frost, M.D.     [facility]  D:  12/28/2016  T:  12/28/2016  Job:  [id number]
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
## 2016-12-28 NOTE — Anesthesia Preprocedure Evaluation (Signed)
Anesthesia Evaluation  Patient identified by MRN, date of birth, ID band Patient awake    Reviewed: Allergy & Precautions, NPO status , Patient's Chart, lab work & pertinent test results  Airway Mallampati: II  TM Distance: >3 FB Neck ROM: Full    Dental no notable dental hx.    Pulmonary neg pulmonary ROS, Current Smoker,    Pulmonary exam normal breath sounds clear to auscultation       Cardiovascular negative cardio ROS Normal cardiovascular exam Rhythm:Regular Rate:Normal     Neuro/Psych negative neurological ROS  negative psych ROS   GI/Hepatic negative GI ROS, Neg liver ROS,   Endo/Other  negative endocrine ROS  Renal/GU negative Renal ROS     Musculoskeletal negative musculoskeletal ROS (+)   Abdominal   Peds  Hematology negative hematology ROS (+)   Anesthesia Other Findings   Reproductive/Obstetrics                             Anesthesia Physical Anesthesia Plan  ASA: II  Anesthesia Plan: General   Post-op Pain Management:    Induction: Intravenous  Airway Management Planned: Mask  Additional Equipment:   Intra-op Plan:   Post-operative Plan:   Informed Consent: I have reviewed the patients History and Physical, chart, labs and discussed the procedure including the risks, benefits and alternatives for the proposed anesthesia with the patient or authorized representative who has indicated his/her understanding and acceptance.   Dental advisory given  Plan Discussed with: CRNA  Anesthesia Plan Comments:         Anesthesia Quick Evaluation

## 2016-12-28 NOTE — Transfer of Care (Signed)
Immediate Anesthesia Transfer of Care Note  Patient: Bradley Mcgrath  Procedure(s) Performed: Procedure(s): CLOSED REDUCTION HIP (Right)  Patient Location: PACU  Anesthesia Type:General  Level of Consciousness: awake  Airway & Oxygen Therapy: Patient Spontanous Breathing  Post-op Assessment: Report given to RN and Post -op Vital signs reviewed and stable  Post vital signs: Reviewed and stable  Last Vitals:  Vitals:   12/28/16 1915 12/28/16 1945  BP: (!) 149/94 133/82  Pulse: (!) 103 73  Resp: 16 10  Temp:      Last Pain:  Vitals:   12/28/16 1830  TempSrc:   PainSc: 8          Complications: No apparent anesthesia complications

## 2016-12-28 NOTE — ED Notes (Signed)
Wasted the rest of the propofol that was not used in procedural sedation with Denyse Amassorey, RN in sharps.
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
## 2016-12-28 NOTE — Brief Op Note (Signed)
12/28/2016  9:10 PM  PATIENT:  Bradley Mcgrath  36 y.o. male  PRE-OPERATIVE DIAGNOSIS:  Right THA Dislocation  POST-OPERATIVE DIAGNOSIS:  Right THA Dislocation  PROCEDURE:  Procedure(s): CLOSED REDUCTION HIP (Right)  STRESS EVALUATION UNDER FLOUROSCOPY  SURGEON:  Surgeon(s) and Role:    Myrene Galas* Weiland Tomich, MD - Primary  PHYSICIAN ASSISTANT: Montez MoritaKEITH PAUL, PA-C  ANESTHESIA:   general  EBL:  Total I/O In: 300 [I.V.:300] Out: -   BLOOD ADMINISTERED:none  DRAINS: none   LOCAL MEDICATIONS USED:  NONE  SPECIMEN:  No Specimen  DISPOSITION OF SPECIMEN:  N/A  COUNTS:  YES  TOURNIQUET:  * No tourniquets in log *  DICTATION: .Other Dictation: Dictation Number [id number]  PLAN OF CARE: Admit to inpatient   PATIENT DISPOSITION:  PACU - hemodynamically stable.   Delay start of Pharmacological VTE agent (>24hrs) due to surgical blood loss or risk of bleeding: no

## 2016-12-28 NOTE — Sedation Documentation (Signed)
Pt alert and assisting with reduction.

## 2016-12-28 NOTE — Discharge Instructions (Signed)
°  Orthopaedic Discharge instructions  Touchdown weightbearing Right leg with crutches x 4 weeks  Continue to follow posterior right total hip precautions  Knee immobilizer when sleeping for the next 7 days  Follow up with [facility] in 7-10 days

## 2016-12-28 NOTE — ED Notes (Signed)
CO2 monitor on pt, cardiac monitor attached to pt, fluids running. Crash cart outside of room. Consent for sedation and procedure signed and at bedside.

## 2016-12-28 NOTE — ED Notes (Signed)
[facility]

## 2016-12-28 NOTE — ED Notes (Addendum)
OR called for pt to be transported to [facility]. Person who called me said no report needed

## 2016-12-30 NOTE — Anesthesia Postprocedure Evaluation (Addendum)
Anesthesia Post Note  Patient: Bradley DoveRoger D Navia  Procedure(s) Performed: Procedure(s) (LRB): CLOSED REDUCTION HIP (Right)  Patient location during evaluation: PACU Anesthesia Type: General Level of consciousness: sedated and patient cooperative Pain management: pain level controlled Vital Signs Assessment: post-procedure vital signs reviewed and stable Respiratory status: spontaneous breathing Cardiovascular status: stable Anesthetic complications: no       Last Vitals:  Vitals:   12/28/16 2115 12/28/16 2130  BP: (!) 144/87 (!) 158/89  Pulse: 68 62  Resp:  15  Temp:  36.3 C    Last Pain:  Vitals:   12/28/16 1830  TempSrc:   PainSc: 8                  Lewie LoronJohn Delana Manganello

## 2016-12-31 ENCOUNTER — Encounter (HOSPITAL_COMMUNITY): Payer: Self-pay | Admitting: Orthopedic Surgery

## 2017-01-08 ENCOUNTER — Encounter (INDEPENDENT_AMBULATORY_CARE_PROVIDER_SITE_OTHER): Payer: Self-pay | Admitting: Physician Assistant

## 2017-01-08 ENCOUNTER — Ambulatory Visit (INDEPENDENT_AMBULATORY_CARE_PROVIDER_SITE_OTHER): Payer: Self-pay | Admitting: Physician Assistant

## 2017-01-08 VITALS — BP 142/85 | HR 90 | Temp 98.1°F | Ht 72.0 in | Wt 166.0 lb

## 2017-01-08 DIAGNOSIS — R300 Dysuria: Secondary | ICD-10-CM

## 2017-01-08 DIAGNOSIS — K8020 Calculus of gallbladder without cholecystitis without obstruction: Secondary | ICD-10-CM

## 2017-01-08 DIAGNOSIS — F1290 Cannabis use, unspecified, uncomplicated: Secondary | ICD-10-CM

## 2017-01-08 DIAGNOSIS — F411 Generalized anxiety disorder: Secondary | ICD-10-CM

## 2017-01-08 DIAGNOSIS — R202 Paresthesia of skin: Secondary | ICD-10-CM

## 2017-01-08 DIAGNOSIS — F1390 Sedative, hypnotic, or anxiolytic use, unspecified, uncomplicated: Secondary | ICD-10-CM

## 2017-01-08 DIAGNOSIS — F1310 Sedative, hypnotic or anxiolytic abuse, uncomplicated: Secondary | ICD-10-CM

## 2017-01-08 LAB — POCT URINALYSIS DIPSTICK
BILIRUBIN UA: NEGATIVE
Blood, UA: NEGATIVE
Glucose, UA: NEGATIVE
KETONES UA: NEGATIVE
LEUKOCYTES UA: NEGATIVE
Nitrite, UA: NEGATIVE
PH UA: 7.5 (ref 5.0–8.0)
Protein, UA: NEGATIVE
Spec Grav, UA: 1.015 (ref 1.010–1.025)
Urobilinogen, UA: 0.2 E.U./dL

## 2017-01-08 MED ORDER — GABAPENTIN 300 MG PO CAPS: 300.0000 mg | ORAL_CAPSULE | Freq: Three times a day (TID) | ORAL | 3 refills | Status: DC

## 2017-01-08 MED ORDER — SERTRALINE HCL 100 MG PO TABS: 100.0000 mg | ORAL_TABLET | Freq: Every day | ORAL | 3 refills | Status: DC

## 2017-01-08 MED ORDER — URSODIOL 250 MG PO TABS: 250.0000 mg | ORAL_TABLET | Freq: Three times a day (TID) | ORAL | 5 refills | Status: DC

## 2017-01-08 MED FILL — GABAPENTIN 300 MG CAPSULE: 300 | 30 days supply | Qty: 90 | Fill #0

## 2017-01-08 MED FILL — SERTRALINE HCL 100 MG TAB: 100 | 30 days supply | Qty: 30 | Fill #0

## 2017-01-08 NOTE — Patient Instructions (Signed)

## 2017-01-08 NOTE — Progress Notes (Signed)
Subjective:  Patient ID: Bradley DoveRoger D Beyene, male    DOB: 24-Oct-1980  Age: 36 y.o. MRN: [id number]  CC: f/u ED  HPI Bradley Mcgrath is a 36 y.o. male with a PMH of anxiety, right hip arthroplasty and choledocholithiasis presents on f/u of ED visit on 12/28/16 for dislocation of the right hip. Hip was put back into alignment. States his main concern today is anxiety and chronic abdominal pain. Says he has stopped taking benzodiazepines not prescribed to him and has stopped smoking marijuana. Would like a prescription of his own for Xanax. Has not started Sertraline yet due to cost. In regards to chronic RUQ abdominal pain, he still feels nauseated when he eats. Attributed to choledocholithiasis. He has not been able to afford surgery. Has not completed his [employer] and [employer] applications yet. Lastly, complains of burning on urination "sometimes". No current burning, urinary frequency, urinary urgency, or urinary hesitancy. Does not endorse CP, palpitations, SOB, HA, fever, chills, hematochezia, melena, or rash.   Outpatient Medications Prior to Visit  Medication Sig Dispense Refill  . Aspirin-Salicylamide-Caffeine (ARTHRITIS STRENGTH BC POWDER PO) Take 1 packet by mouth daily as needed (pain).    . Buprenorphine HCl-Naloxone HCl (SUBOXONE) 8-2 MG FILM Place 0.5 Film under the tongue daily as needed.    Marland Kitchen. omeprazole (PRILOSEC) 40 MG capsule Take 1 capsule (40 mg total) by mouth daily. 30 capsule 3  . gabapentin (NEURONTIN) 300 MG capsule Take 1 capsule (300 mg total) by mouth 3 (three) times daily. 90 capsule 3  . ketorolac (TORADOL) 10 MG tablet Take 1 tablet (10 mg total) by mouth every 6 (six) hours as needed for moderate pain. 20 tablet 0  . sertraline (ZOLOFT) 100 MG tablet Take 1 tablet (100 mg total) by mouth daily. 30 tablet 3   No facility-administered medications prior to visit.      ROS Review of Systems  Constitutional: Negative for chills, fever and malaise/fatigue.  Eyes:  Negative for blurred vision.  Respiratory: Negative for shortness of breath.   Cardiovascular: Negative for chest pain and palpitations.  Gastrointestinal: Positive for abdominal pain and nausea.  Genitourinary: Negative for dysuria and hematuria.  Musculoskeletal: Positive for joint pain. Negative for myalgias.  Skin: Negative for rash.  Neurological: Negative for tingling and headaches.  Psychiatric/Behavioral: Negative for depression. The patient is nervous/anxious.     Objective:  BP (!) 142/85 (BP Location: Right Arm, Patient Position: Sitting, Cuff Size: Normal)   Pulse 90   Temp 98.1 F (36.7 C) (Oral)   Ht 6' (1.829 m)   Wt 166 lb (75.3 kg)   BMI 22.51 kg/m   BP/Weight 01/08/2017 12/28/2016 12/24/2016  Systolic BP 142 158 152  Diastolic BP 85 89 93  Wt. (Lbs) 166 162 -  BMI 22.51 21.97 -      Physical Exam  Constitutional: He is oriented to person, place, and time.  Well developed, well nourished, NAD, polite  HENT:  Head: Normocephalic and atraumatic.  Eyes: No scleral icterus.  Neck: Normal range of motion. Neck supple. No thyromegaly present.  Cardiovascular: Normal rate, regular rhythm and normal heart sounds.   Pulmonary/Chest: Effort normal and breath sounds normal.  Abdominal: Soft. Bowel sounds are normal. He exhibits no distension and no mass. There is tenderness (mild RUQ TTP). There is no rebound and no guarding.  Musculoskeletal: He exhibits no edema.  Neurological: He is alert and oriented to person, place, and time. No cranial nerve deficit. Coordination normal.  Skin: Skin is warm and dry. No rash noted. No erythema. No pallor.  Psychiatric: His behavior is normal. Thought content normal.  Anxious mood  Vitals reviewed.    Assessment & Plan:   1. Calculus of gallbladder without cholecystitis without obstruction - 3.6 cm gallstone with additional smaller gallstones and sludge. Has not been able to have surgery yet. Will start on ursodiol 250 mg  TID until he can finish his financial assistance applications. He will then be referred for cholecystectomy. In the mean time, he is counseled on going to ED if symptoms worsen.  2. Marijuana use - Drug Screen, Urine  3. Benzodiazepine misuse - Drug Screen, Urine  4. Generalized anxiety disorder - Begin sertraline (ZOLOFT) 100 MG tablet; Take 1 tablet (100 mg total) by mouth daily.  Dispense: 30 tablet; Refill: 3  5. Paresthesia - Begin gabapentin (NEURONTIN) 300 MG capsule; Take 1 capsule (300 mg total) by mouth 3 (three) times daily.  Dispense: 90 capsule; Refill: 3  6. Dysuria - Urinalysis Dipstick negative in clinic today.   Meds ordered this encounter  Medications  . sertraline (ZOLOFT) 100 MG tablet    Sig: Take 1 tablet (100 mg total) by mouth daily.    Dispense:  30 tablet    Refill:  3    Order Specific Question:   Supervising Provider    Answer:   Quentin Angst [id number]  . gabapentin (NEURONTIN) 300 MG capsule    Sig: Take 1 capsule (300 mg total) by mouth 3 (three) times daily.    Dispense:  90 capsule    Refill:  3    Order Specific Question:   Supervising Provider    Answer:   Quentin Angst [id number]  . ursodiol (ACTIGALL) 250 MG tablet    Sig: Take 1 tablet (250 mg total) by mouth 3 (three) times daily.    Dispense:  90 tablet    Refill:  5    Order Specific Question:   Supervising Provider    Answer:   Quentin Angst [id number]    Follow-up: Return in about 8 weeks (around 03/05/2017) for gallstones.   Loletta Specter PA

## 2017-01-09 ENCOUNTER — Other Ambulatory Visit (INDEPENDENT_AMBULATORY_CARE_PROVIDER_SITE_OTHER): Payer: Self-pay | Admitting: Physician Assistant

## 2017-01-09 DIAGNOSIS — F411 Generalized anxiety disorder: Secondary | ICD-10-CM

## 2017-01-09 LAB — DRUG SCREEN, URINE
Amphetamines, Urine: NEGATIVE ng/mL
Barbiturate screen, urine: NEGATIVE ng/mL
Benzodiazepine Quant, Ur: NEGATIVE ng/mL
CANNABINOID QUANT UR: NEGATIVE ng/mL
Cocaine (Metab.): NEGATIVE ng/mL
Opiate Quant, Ur: NEGATIVE ng/mL
PCP QUANT UR: NEGATIVE ng/mL

## 2017-01-09 MED ORDER — CLONAZEPAM 0.5 MG PO TABS: 0.5000 mg | ORAL_TABLET | Freq: Two times a day (BID) | ORAL | 0 refills | Status: DC | PRN

## 2017-01-09 NOTE — Progress Notes (Signed)
Drug screening negative. Benzo now prescribed for anxiety.

## 2017-01-11 ENCOUNTER — Telehealth (INDEPENDENT_AMBULATORY_CARE_PROVIDER_SITE_OTHER): Payer: Self-pay | Admitting: Physician Assistant

## 2017-01-11 ENCOUNTER — Ambulatory Visit: Payer: Self-pay

## 2017-01-11 NOTE — Telephone Encounter (Signed)
Spoke with patient and informed him [facility] does not carry medication because it is too expensive. Maryjean Mornempestt S Roberts, CMA

## 2017-01-11 NOTE — Telephone Encounter (Signed)
Patient left voicemail stated went to [facility] but was not able to get RX for gallbladder, doesn't know why, might need override.   Would like to know if Bradley MessingRoger Gomez PA can help him get RX for his gallbladder.  Stated to call him back at [phone]  I called him back no answer. Please follow up with patient

## 2017-01-18 ENCOUNTER — Ambulatory Visit: Payer: Self-pay

## 2017-01-28 ENCOUNTER — Telehealth (INDEPENDENT_AMBULATORY_CARE_PROVIDER_SITE_OTHER): Payer: Self-pay | Admitting: Physician Assistant

## 2017-01-28 NOTE — Telephone Encounter (Signed)
Patient called requesting refills on medication: Gabapentin  Please send to    [facility]:   Please follow up with patient.

## 2017-01-28 NOTE — Telephone Encounter (Signed)
Patient will have to wait the 10 days to have Rx refilled. This has been explained to the patient. Bradley Mcgrath S Wynton Hufstetler, CMA

## 2017-01-28 NOTE — Telephone Encounter (Signed)
FWD to PCP. Odette Watanabe S Clydine Parkison, CMA  

## 2017-01-28 NOTE — Telephone Encounter (Signed)
I have already written for Gabapentin 300mg  TID for a total of 4 months.Written on 01/08/17.

## 2017-01-28 NOTE — Telephone Encounter (Signed)
Patient called back stated pharmacy told him they can refill medication in 10 day but he is out of medication because he took more medication than instructed. Wants to know if he can get another RX. Stated he took more because he was having pain. And still has pain.  Please follow  Up with patient.

## 2017-01-28 NOTE — Telephone Encounter (Signed)
Patient informed. Tempestt S Roberts, CMA  

## 2017-02-11 ENCOUNTER — Telehealth (INDEPENDENT_AMBULATORY_CARE_PROVIDER_SITE_OTHER): Payer: Self-pay | Admitting: Physician Assistant

## 2017-02-11 NOTE — Telephone Encounter (Signed)
FWD to PCP. Jonalyn Sedlak S Yehuda Printup, CMA  

## 2017-02-11 NOTE — Telephone Encounter (Signed)
Needs appointment

## 2017-02-11 NOTE — Telephone Encounter (Signed)
Please follow up with patient at ph: [phone] per patient.

## 2017-02-11 NOTE — Telephone Encounter (Signed)
Patient came in today stated needs refills on:   clonazePAM (KLONOPIN) 0.5 MG tablet [[id number]][[id number]]   Please follow up with patient. Stated he is out of medication.

## 2017-02-14 NOTE — Telephone Encounter (Signed)
Have attempted to call patient several times and no answer. Maryjean Mornempestt S Roberts, CMA

## 2017-02-17 ENCOUNTER — Encounter (HOSPITAL_COMMUNITY): Payer: Self-pay | Admitting: Emergency Medicine

## 2017-02-17 ENCOUNTER — Emergency Department (HOSPITAL_COMMUNITY)
Admission: EM | Admit: 2017-02-17 | Discharge: 2017-02-17 | Disposition: A | Discharge status: Home / Self Care | Payer: Self-pay | Attending: Emergency Medicine | Admitting: Emergency Medicine

## 2017-02-17 ENCOUNTER — Emergency Department (HOSPITAL_COMMUNITY): Payer: Self-pay

## 2017-02-17 DIAGNOSIS — F17210 Nicotine dependence, cigarettes, uncomplicated: Secondary | ICD-10-CM | POA: Insufficient documentation

## 2017-02-17 DIAGNOSIS — K8050 Calculus of bile duct without cholangitis or cholecystitis without obstruction: Secondary | ICD-10-CM

## 2017-02-17 DIAGNOSIS — Z79899 Other long term (current) drug therapy: Secondary | ICD-10-CM | POA: Insufficient documentation

## 2017-02-17 DIAGNOSIS — R109 Unspecified abdominal pain: Secondary | ICD-10-CM

## 2017-02-17 DIAGNOSIS — K8020 Calculus of gallbladder without cholecystitis without obstruction: Secondary | ICD-10-CM | POA: Insufficient documentation

## 2017-02-17 HISTORY — DX: Calculus of kidney: N20.0

## 2017-02-17 LAB — COMPREHENSIVE METABOLIC PANEL
ALBUMIN: 4.1 g/dL (ref 3.5–5.0)
ALK PHOS: 106 U/L (ref 38–126)
ALT: 26 U/L (ref 17–63)
AST: 19 U/L (ref 15–41)
Anion gap: 10 (ref 5–15)
BILIRUBIN TOTAL: 0.6 mg/dL (ref 0.3–1.2)
BUN: 11 mg/dL (ref 6–20)
CO2: 24 mmol/L (ref 22–32)
Calcium: 9.1 mg/dL (ref 8.9–10.3)
Chloride: 102 mmol/L (ref 101–111)
Creatinine, Ser: 0.88 mg/dL (ref 0.61–1.24)
GFR calc Af Amer: >60 mL/min (ref >60)
GFR calc non Af Amer: >60 mL/min (ref >60)
GLUCOSE: 102 mg/dL — AB (ref 65–99)
POTASSIUM: 4 mmol/L (ref 3.5–5.1)
Sodium: 136 mmol/L (ref 135–145)
TOTAL PROTEIN: 7.1 g/dL (ref 6.5–8.1)

## 2017-02-17 LAB — URINALYSIS, ROUTINE W REFLEX MICROSCOPIC
BACTERIA UA: NONE SEEN
BILIRUBIN URINE: NEGATIVE
GLUCOSE, UA: NEGATIVE mg/dL
KETONES UR: NEGATIVE mg/dL
Leukocytes, UA: NEGATIVE
NITRITE: NEGATIVE
Protein, ur: NEGATIVE mg/dL
SQUAMOUS EPITHELIAL / LPF: NONE SEEN
Specific Gravity, Urine: 1.017 (ref 1.005–1.030)
pH: 5 (ref 5.0–8.0)

## 2017-02-17 LAB — CBC
HEMATOCRIT: 45.5 % (ref 39.0–52.0)
Hemoglobin: 16 g/dL (ref 13.0–17.0)
MCH: 30.9 pg (ref 26.0–34.0)
MCHC: 35.2 g/dL (ref 30.0–36.0)
MCV: 87.8 fL (ref 78.0–100.0)
Platelets: 219 10*3/uL (ref 150–400)
RBC: 5.18 MIL/uL (ref 4.22–5.81)
RDW: 13.2 % (ref 11.5–15.5)
WBC: 10.6 10*3/uL — ABNORMAL HIGH (ref 4.0–10.5)

## 2017-02-17 LAB — LIPASE, BLOOD: Lipase: 23 U/L (ref 11–51)

## 2017-02-17 MED ORDER — TRAMADOL HCL 50 MG PO TABS: 50.0000 mg | ORAL_TABLET | Freq: Four times a day (QID) | ORAL | 0 refills | Status: DC | PRN
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
## 2017-02-17 NOTE — ED Notes (Signed)
Patient transported to CT 

## 2017-02-17 NOTE — ED Triage Notes (Signed)
Pt c/o right sided abdominal/flank pain onset yesterday with nausea. Pt has history of kidney stones and reports symptoms mimic.

## 2017-02-17 NOTE — ED Notes (Signed)
Pt unable to void at this time. 

## 2017-02-17 NOTE — Discharge Instructions (Signed)
Call ER care manager tomorrow at [phone]. They can discuss with you the possibility of assistance with follow up appointments.  Lofat diet. Ultram for pain. Return to ER with fever, jaundice (yellow skin), worsening symptoms.

## 2017-02-17 NOTE — ED Provider Notes (Signed)
Patient seen and evaluated. Discussed with Dr. Constance Goltzlson. Patient with episode of rectal quadrant pain. Seemed reminiscent of ureteral stone. CT shows large gallstone, and choledocholithiasis. CBD stones have been noted from 2016. When I asked why he has not had this address he states "no insurance". Have given him the number for our case manager and asked them to call regarding the possibility of Hospital assistance with GI follow-up. Bland boring low-fat diet. Pain medication. ER with acute changes fever jaundice.   Rolland PorterJames, Chamaine Stankus, MD 02/17/17 2020

## 2017-02-21 ENCOUNTER — Ambulatory Visit (INDEPENDENT_AMBULATORY_CARE_PROVIDER_SITE_OTHER): Payer: Self-pay | Admitting: Physician Assistant

## 2017-02-22 NOTE — Addendum Note (Signed)
Addendum  created 02/22/17 1054 by Lewie LoronGermeroth, Mychal Decarlo, MD   Sign clinical note

## 2017-02-26 ENCOUNTER — Ambulatory Visit (INDEPENDENT_AMBULATORY_CARE_PROVIDER_SITE_OTHER): Payer: Self-pay | Admitting: Physician Assistant

## 2017-02-28 MED FILL — SERTRALINE HCL 100 MG TAB: 100 | 30 days supply | Qty: 30 | Fill #1

## 2017-02-28 MED FILL — GABAPENTIN 300 MG CAPSULE: 300 | 30 days supply | Qty: 90 | Fill #1

## 2017-11-22 ENCOUNTER — Emergency Department (HOSPITAL_COMMUNITY)
Admission: EM | Admit: 2017-11-22 | Discharge: 2017-11-22 | Disposition: A | Discharge status: Home / Self Care | Payer: Self-pay | Attending: Emergency Medicine | Admitting: Emergency Medicine

## 2017-11-22 ENCOUNTER — Emergency Department (HOSPITAL_COMMUNITY): Payer: Self-pay

## 2017-11-22 ENCOUNTER — Encounter (HOSPITAL_COMMUNITY): Payer: Self-pay

## 2017-11-22 DIAGNOSIS — Z96649 Presence of unspecified artificial hip joint: Secondary | ICD-10-CM

## 2017-11-22 DIAGNOSIS — X58XXXA Exposure to other specified factors, initial encounter: Secondary | ICD-10-CM | POA: Insufficient documentation

## 2017-11-22 DIAGNOSIS — T84029A Dislocation of unspecified internal joint prosthesis, initial encounter: Secondary | ICD-10-CM

## 2017-11-22 DIAGNOSIS — F17210 Nicotine dependence, cigarettes, uncomplicated: Secondary | ICD-10-CM | POA: Insufficient documentation

## 2017-11-22 DIAGNOSIS — T84020A Dislocation of internal right hip prosthesis, initial encounter: Secondary | ICD-10-CM | POA: Insufficient documentation

## 2017-11-22 DIAGNOSIS — Z79899 Other long term (current) drug therapy: Secondary | ICD-10-CM | POA: Insufficient documentation

## 2017-11-22 DIAGNOSIS — Y998 Other external cause status: Secondary | ICD-10-CM | POA: Insufficient documentation

## 2017-11-22 DIAGNOSIS — Y929 Unspecified place or not applicable: Secondary | ICD-10-CM | POA: Insufficient documentation

## 2017-11-22 DIAGNOSIS — Y9389 Activity, other specified: Secondary | ICD-10-CM | POA: Insufficient documentation

## 2017-11-22 DIAGNOSIS — Y828 Other medical devices associated with adverse incidents: Secondary | ICD-10-CM | POA: Insufficient documentation

## 2017-11-22 LAB — I-STAT CHEM 8, ED
BUN: 16 mg/dL (ref 6–20)
CREATININE: 0.6 mg/dL — AB (ref 0.61–1.24)
Calcium, Ion: 1.13 mmol/L — ABNORMAL LOW (ref 1.15–1.40)
Chloride: 102 mmol/L (ref 101–111)
Glucose, Bld: 106 mg/dL — ABNORMAL HIGH (ref 65–99)
HEMATOCRIT: 36 % — AB (ref 39.0–52.0)
Hemoglobin: 12.2 g/dL — ABNORMAL LOW (ref 13.0–17.0)
Potassium: 3.7 mmol/L (ref 3.5–5.1)
Sodium: 138 mmol/L (ref 135–145)
TCO2: 26 mmol/L (ref 22–32)

## 2017-11-22 LAB — ETHANOL: Alcohol, Ethyl (B): <10 mg/dL (ref <10)

## 2017-11-22 MED ORDER — PROPOFOL 10 MG/ML IV BOLUS
INTRAVENOUS | Status: AC
  Filled 2017-11-22: qty 20

## 2017-11-22 MED ORDER — PROPOFOL 10 MG/ML IV BOLUS
INTRAVENOUS | Status: AC | PRN
  Administered 2017-11-22 (×2): 40 mg via INTRAVENOUS

## 2017-11-22 MED ORDER — PROPOFOL 10 MG/ML IV BOLUS: 0.5000 mg/kg | Freq: Once | INTRAVENOUS | Status: DC

## 2017-11-22 NOTE — ED Notes (Signed)
MD aware of still needing urine. MD states we do not need urine if pt is awake.

## 2017-11-22 NOTE — ED Notes (Signed)
Pt handed urinal and informed of need for urine specimen. Pt will attempt to void.

## 2017-11-22 NOTE — ED Notes (Signed)
ED Provider at bedside. 

## 2017-11-22 NOTE — Discharge Instructions (Signed)
Follow-up with your orthopedic surgeon next week.  No strenuous activity until followed up by your orthopedist.
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
## 2017-11-22 NOTE — ED Notes (Signed)
Pt became agitated when asked for urine. Pt up getting dressed. Refused to sign for discharge.

## 2017-11-22 NOTE — ED Triage Notes (Signed)
Pt states that his R hip popped out of joint, hx of the same and he has had  to have reduction several reductions in the past of the artifical hip

## 2017-11-22 NOTE — Sedation Documentation (Signed)
Judd Lienelo, MD and Dahlia ClientBrowning, GeorgiaPA attempting reduction

## 2018-01-01 ENCOUNTER — Encounter (HOSPITAL_COMMUNITY): Payer: Self-pay

## 2018-01-01 ENCOUNTER — Emergency Department (HOSPITAL_COMMUNITY)
Admission: EM | Admit: 2018-01-01 | Discharge: 2018-01-01 | Disposition: A | Discharge status: Home / Self Care | Payer: Self-pay | Attending: Emergency Medicine | Admitting: Emergency Medicine

## 2018-01-01 ENCOUNTER — Emergency Department (HOSPITAL_COMMUNITY): Payer: Self-pay

## 2018-01-01 ENCOUNTER — Other Ambulatory Visit: Payer: Self-pay

## 2018-01-01 DIAGNOSIS — F17210 Nicotine dependence, cigarettes, uncomplicated: Secondary | ICD-10-CM | POA: Insufficient documentation

## 2018-01-01 DIAGNOSIS — L89892 Pressure ulcer of other site, stage 2: Secondary | ICD-10-CM | POA: Insufficient documentation

## 2018-01-01 DIAGNOSIS — Z79899 Other long term (current) drug therapy: Secondary | ICD-10-CM | POA: Insufficient documentation

## 2018-01-01 LAB — COMPREHENSIVE METABOLIC PANEL
ALK PHOS: 147 U/L — AB (ref 38–126)
ALT: 32 U/L (ref 17–63)
AST: 21 U/L (ref 15–41)
Albumin: 3.6 g/dL (ref 3.5–5.0)
Anion gap: 8 (ref 5–15)
BUN: 18 mg/dL (ref 6–20)
CALCIUM: 9 mg/dL (ref 8.9–10.3)
CHLORIDE: 106 mmol/L (ref 101–111)
CO2: 24 mmol/L (ref 22–32)
CREATININE: 0.82 mg/dL (ref 0.61–1.24)
GFR calc Af Amer: >60 mL/min (ref >60)
GFR calc non Af Amer: >60 mL/min (ref >60)
Glucose, Bld: 104 mg/dL — ABNORMAL HIGH (ref 65–99)
Potassium: 3.8 mmol/L (ref 3.5–5.1)
SODIUM: 138 mmol/L (ref 135–145)
Total Bilirubin: 0.4 mg/dL (ref 0.3–1.2)
Total Protein: 6.5 g/dL (ref 6.5–8.1)

## 2018-01-01 LAB — CBC WITH DIFFERENTIAL/PLATELET
Abs Immature Granulocytes: 0 10*3/uL (ref 0.0–0.1)
Basophils Absolute: 0 10*3/uL (ref 0.0–0.1)
Basophils Relative: 1 %
EOS ABS: 0.2 10*3/uL (ref 0.0–0.7)
EOS PCT: 3 %
HEMATOCRIT: 41.1 % (ref 39.0–52.0)
Hemoglobin: 14.1 g/dL (ref 13.0–17.0)
Immature Granulocytes: 0 %
LYMPHS ABS: 2.1 10*3/uL (ref 0.7–4.0)
Lymphocytes Relative: 29 %
MCH: 29.5 pg (ref 26.0–34.0)
MCHC: 34.3 g/dL (ref 30.0–36.0)
MCV: 86 fL (ref 78.0–100.0)
MONO ABS: 0.5 10*3/uL (ref 0.1–1.0)
Monocytes Relative: 6 %
Neutro Abs: 4.5 10*3/uL (ref 1.7–7.7)
Neutrophils Relative %: 61 %
Platelets: 257 10*3/uL (ref 150–400)
RBC: 4.78 MIL/uL (ref 4.22–5.81)
RDW: 12.6 % (ref 11.5–15.5)
WBC: 7.3 10*3/uL (ref 4.0–10.5)

## 2018-01-01 NOTE — Discharge Instructions (Addendum)
Please read attached information. If you experience any new or worsening signs or symptoms please return to the emergency room for evaluation. Please follow-up with your primary care provider or specialist as discussed.  °

## 2018-01-01 NOTE — ED Triage Notes (Signed)
Pt endorses having a right foot infection x 6-7 months, has been seen here and treated for it during that time. Took antibiotics but it has not getting better. VSS

## 2018-01-01 NOTE — ED Provider Notes (Signed)
Patient placed in Quick Look pathway, seen and evaluated   Chief Complaint: Right foot wound  HPI:   Bradley Mcgrath is a 37 y.o. male, presenting to the ED with right foot wound present for the last 6 to 7 months.  States it originally began due to rubbing in his shoe.  States wound has been growing for the past month or two.   Patient is in place custody for an outstanding warrant.  The wound was examined at the jail and it was determined that he would need to be medically cleared prior to going back to the jail. Denies fever, numbness, weakness, subsequent injury.  ROS: Right foot wound (one)  Physical Exam:   Gen: No distress  Neuro: Awake and Alert  Skin: Warm    Focused Exam:   No diaphoresis.  No pallor.  Pulmonary: No tachypnea  Cardiac: No tachycardia.  Peripheral pulses intact.  Neurologic: Sensation to light touch intact in the distal right foot.  Motor function intact.  MSK: Patient has a wound to the plantar surface of the right foot.  No noted surrounding erythema.  No purulent discharge.  Minimal tenderness.  No noted swelling.           Initiation of care has begun. The patient has been counseled on the process, plan, and necessity for staying for the completion/evaluation, and the remainder of the medical screening examination   [name] 01/01/18 Tillman Abide, MD 01/01/18 2311
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
## 2020-03-22 ENCOUNTER — Emergency Department (HOSPITAL_COMMUNITY): Payer: Self-pay

## 2020-03-22 ENCOUNTER — Other Ambulatory Visit: Payer: Self-pay

## 2020-03-22 ENCOUNTER — Emergency Department (HOSPITAL_COMMUNITY)
Admission: EM | Admit: 2020-03-22 | Discharge: 2020-03-23 | Disposition: A | Discharge status: Home / Self Care | Payer: Self-pay | Attending: Emergency Medicine | Admitting: Emergency Medicine

## 2020-03-22 ENCOUNTER — Encounter (HOSPITAL_COMMUNITY): Payer: Self-pay

## 2020-03-22 DIAGNOSIS — R748 Abnormal levels of other serum enzymes: Secondary | ICD-10-CM

## 2020-03-22 DIAGNOSIS — Z7982 Long term (current) use of aspirin: Secondary | ICD-10-CM | POA: Insufficient documentation

## 2020-03-22 DIAGNOSIS — F209 Schizophrenia, unspecified: Secondary | ICD-10-CM | POA: Insufficient documentation

## 2020-03-22 DIAGNOSIS — F17210 Nicotine dependence, cigarettes, uncomplicated: Secondary | ICD-10-CM | POA: Insufficient documentation

## 2020-03-22 DIAGNOSIS — Z20822 Contact with and (suspected) exposure to covid-19: Secondary | ICD-10-CM | POA: Insufficient documentation

## 2020-03-22 DIAGNOSIS — R45851 Suicidal ideations: Secondary | ICD-10-CM | POA: Insufficient documentation

## 2020-03-22 LAB — CBC WITH DIFFERENTIAL/PLATELET
Abs Immature Granulocytes: 0.03 10*3/uL (ref 0.00–0.07)
Basophils Absolute: 0.1 10*3/uL (ref 0.0–0.1)
Basophils Relative: 1 %
Eosinophils Absolute: 0.2 10*3/uL (ref 0.0–0.5)
Eosinophils Relative: 3 %
HCT: 45.9 % (ref 39.0–52.0)
Hemoglobin: 15.8 g/dL (ref 13.0–17.0)
Immature Granulocytes: 0 %
Lymphocytes Relative: 23 %
Lymphs Abs: 2 10*3/uL (ref 0.7–4.0)
MCH: 30.2 pg (ref 26.0–34.0)
MCHC: 34.4 g/dL (ref 30.0–36.0)
MCV: 87.8 fL (ref 80.0–100.0)
Monocytes Absolute: 0.8 10*3/uL (ref 0.1–1.0)
Monocytes Relative: 9 %
Neutro Abs: 5.7 10*3/uL (ref 1.7–7.7)
Neutrophils Relative %: 64 %
Platelets: 236 10*3/uL (ref 150–400)
RBC: 5.23 MIL/uL (ref 4.22–5.81)
RDW: 12.6 % (ref 11.5–15.5)
WBC: 8.7 10*3/uL (ref 4.0–10.5)
nRBC: 0 % (ref 0.0–0.2)

## 2020-03-22 LAB — RAPID URINE DRUG SCREEN, HOSP PERFORMED
Amphetamines: POSITIVE — AB
Barbiturates: NOT DETECTED
Benzodiazepines: NOT DETECTED
Cocaine: NOT DETECTED
Opiates: NOT DETECTED
Tetrahydrocannabinol: POSITIVE — AB

## 2020-03-22 LAB — COMPREHENSIVE METABOLIC PANEL
ALT: 244 U/L — ABNORMAL HIGH (ref 0–44)
AST: 82 U/L — ABNORMAL HIGH (ref 15–41)
Albumin: 4.4 g/dL (ref 3.5–5.0)
Alkaline Phosphatase: 207 U/L — ABNORMAL HIGH (ref 38–126)
Anion gap: 10 (ref 5–15)
BUN: 22 mg/dL — ABNORMAL HIGH (ref 6–20)
CO2: 24 mmol/L (ref 22–32)
Calcium: 9.3 mg/dL (ref 8.9–10.3)
Chloride: 106 mmol/L (ref 98–111)
Creatinine, Ser: 0.84 mg/dL (ref 0.61–1.24)
GFR calc Af Amer: >60 mL/min (ref >60)
GFR calc non Af Amer: >60 mL/min (ref >60)
Glucose, Bld: 122 mg/dL — ABNORMAL HIGH (ref 70–99)
Potassium: 3.9 mmol/L (ref 3.5–5.1)
Sodium: 140 mmol/L (ref 135–145)
Total Bilirubin: 0.8 mg/dL (ref 0.3–1.2)
Total Protein: 7.7 g/dL (ref 6.5–8.1)

## 2020-03-22 LAB — SALICYLATE LEVEL: Salicylate Lvl: <7 mg/dL — ABNORMAL LOW (ref 7.0–30.0)

## 2020-03-22 LAB — ETHANOL: Alcohol, Ethyl (B): <10 mg/dL (ref <10)

## 2020-03-22 LAB — LIPASE, BLOOD: Lipase: 43 U/L (ref 11–51)

## 2020-03-22 LAB — ACETAMINOPHEN LEVEL: Acetaminophen (Tylenol), Serum: <10 ug/mL — ABNORMAL LOW (ref 10–30)

## 2020-03-22 MED ORDER — ACETAMINOPHEN 325 MG PO TABS: 650.0000 mg | ORAL_TABLET | ORAL | Status: DC | PRN

## 2020-03-22 MED ORDER — IOHEXOL 9 MG/ML PO SOLN
ORAL | Status: AC
  Filled 2020-03-22: qty 1000

## 2020-03-22 MED ORDER — STERILE WATER FOR INJECTION IJ SOLN
INTRAMUSCULAR | Status: AC
  Filled 2020-03-22: qty 10

## 2020-03-22 MED ORDER — ZIPRASIDONE MESYLATE 20 MG IM SOLR
20.0000 mg | Freq: Once | INTRAMUSCULAR | Status: AC
  Administered 2020-03-22: 20 mg via INTRAMUSCULAR
  Filled 2020-03-22 (×2): qty 20

## 2020-03-22 MED ORDER — GABAPENTIN 300 MG PO CAPS
300.0000 mg | ORAL_CAPSULE | Freq: Three times a day (TID) | ORAL | Status: DC
  Administered 2020-03-23: 300 mg via ORAL
  Filled 2020-03-22 (×2): qty 1

## 2020-03-22 MED ORDER — PANTOPRAZOLE SODIUM 40 MG PO TBEC
40.0000 mg | DELAYED_RELEASE_TABLET | Freq: Every day | ORAL | Status: DC
  Administered 2020-03-22 – 2020-03-23 (×2): 40 mg via ORAL
  Filled 2020-03-22 (×2): qty 1

## 2020-03-22 NOTE — ED Notes (Signed)
Patients bracelet was given to security, other belongings in lockers

## 2020-03-22 NOTE — ED Notes (Signed)
Pt asleep and snoring at this time. Normal rise and fall of chest. NAD. Will continue to monitor

## 2020-03-22 NOTE — ED Notes (Signed)
TTS in progress 
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
## 2020-03-22 NOTE — ED Notes (Addendum)
Pt becoming agitated and trying to leave. Cussing and telling [employer] "Francesca Oman can let me leave or yall can blow me." Pt states " yall are talking about me and I will fuck everybody up." EDP informed of pt behavior. Geodon ordered. Security and [employer] at bedside.

## 2020-03-22 NOTE — ED Notes (Signed)
Rad Tech came over to give pt oral contrast. Pt is refusing contrast and CT scan at this time
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
## 2020-03-22 NOTE — ED Triage Notes (Signed)
Per [facility] pt was at [facility] and they were called to transport pt to ED for evaluation.  [facility] took out ivc paperwork stating that pt was observed swatting at the air and conversing with individuals who aren't present.  Also states he appeared to be responding to internal stimuli.  Papers state pt reported feeling frustrated with mental health problems, and wanting to kill himself at times but no plan today.  See IVC papers for further.  Pt denies any auditory of visual hallucinations, denies any SI or HI.  Reports has had HI in the past but not today.  Pt says he has never been suicidal.

## 2020-03-22 NOTE — ED Provider Notes (Signed)
Care assumed from PA Berle Mull, please see his note for full details, but in brief Bradley Mcgrath is a 39 y.o. male who was sent from [facility] after he was found to be swatting at the air and speaking to individuals who were not there. Patient also endorsed suicidal ideations and facility reports he was becoming aggressive. They took out IVC paperwork on the patient. Patient reportedly has cannabis, methamphetamine and alcohol in his system. Patient denies focal medical complaints, but is actively hallucinating and delusional, when asked where he is he states that "he is in outer space and his name is monkey poop".  Evaluation thus far has been significant for elevated LFTs, lab work otherwise has been unremarkable, UDS and Covid swab are pending. Given elevated LFTs and patient's inability to provide good history, ultrasound is not available this evening, will get noncontrast CT to evaluate for any potential hepatic pathology.  TTS consult is pending for psychiatric recommendations. Awaiting med rec to restart patient's regular medications.  BP 136/83 (BP Location: Right Arm)   Pulse (!) 108   Temp 98.3 F (36.8 C) (Oral)   Resp 18   Ht 5\' 8"  (1.727 m)   Wt 85.3 kg   SpO2 100%   BMI 28.59 kg/m   ED Course/Procedures   Labs Reviewed  COMPREHENSIVE METABOLIC PANEL - Abnormal; Notable for the following components:      Result Value   Glucose, Bld 122 (*)    BUN 22 (*)    AST 82 (*)    ALT 244 (*)    Alkaline Phosphatase 207 (*)    All other components within normal limits  SALICYLATE LEVEL - Abnormal; Notable for the following components:   Salicylate Lvl <7.0 (*)    All other components within normal limits  ACETAMINOPHEN LEVEL - Abnormal; Notable for the following components:   Acetaminophen (Tylenol), Serum <10 (*)    All other components within normal limits  SARS CORONAVIRUS 2 BY RT PCR (HOSPITAL ORDER, PERFORMED IN [place] [facility])  CBC WITH DIFFERENTIAL/PLATELET   ETHANOL  LIPASE, BLOOD  RAPID URINE DRUG SCREEN, HOSP PERFORMED   DG Foot Complete Right  Result Date: 03/22/2020 CLINICAL DATA:  Right foot pain.  No known injury. EXAM: RIGHT FOOT COMPLETE - 3+ VIEW COMPARISON:  None. FINDINGS: Deformity of the distal right 5th metacarpal may be posttraumatic or congenital. No acute fracture, subluxation or dislocation. Joint spaces are maintained. IMPRESSION: No acute bony abnormality. Electronically Signed   By: 05/22/2020 M.D.   On: 03/22/2020 18:09     Procedures  MDM   Nursing staff informed me that patient is refusing CT, he does not have any abdominal tenderness on exam, feel it is reasonable to hold off on this at this time will offer patient ultrasound in the morning.  Continue to await TTS recommendations, but at this point aside from potential imaging in the morning patient is medically cleared, if not patient can follow-up as an outpatient he does not have any elevation in bili, is not vomiting, and again does not have abdominal tenderness.  The patient has been placed in psychiatric observation due to the need to provide a safe environment for the patient while obtaining psychiatric consultation and evaluation, as well as ongoing medical and medication management to treat the patient's condition.  The patient has been placed under full IVC at this time.     05/22/2020, PA-C 03/22/20 2017    2018, MD 03/22/20  2214    Vanetta Mulders, MD 03/22/20 2324

## 2020-03-22 NOTE — ED Notes (Signed)
Pt bed moved from hallway 8 closer to desk. Sitter reported room 15 was becoming restless that pt was in hallway. This pt was becoming more anxious as well.

## 2020-03-22 NOTE — ED Notes (Signed)
Pt ambulated to restroom. Refused urine specimen.

## 2020-03-23 ENCOUNTER — Emergency Department (HOSPITAL_COMMUNITY): Payer: Self-pay

## 2020-03-23 NOTE — ED Notes (Addendum)
[facility] called and stated pt could be psychiatrically cleared at this time. TTS staff to write the note about their encounter today so the EDP can do discharge papers and rescind IVC.

## 2020-03-23 NOTE — ED Provider Notes (Signed)
Patient has been seen by TTS who feels as though he no longer requires IVC and he is cleared from a psychiatric standpoint.  I have reevaluated the patient who denies to me he is experiencing any auditory or visual hallucinations.  He also denies any suicidal or homicidal ideation.  He is very calm, cooperative, and is comfortable with going home.  Patient to be discharged and IVC rescinded.   Geoffery Lyons, MD 03/23/20 1340

## 2020-03-23 NOTE — Discharge Instructions (Addendum)
Return to the emergency department if you develop any new and/or concerning symptoms. °

## 2020-03-23 NOTE — Progress Notes (Signed)
Reassessment: Patient seen via telepsych. Chart reviewed. Patient is a 39 year old male with history of polysubstance use, schizophrenia, anxiety, and depression. He was IVC'd from [facility] for becoming violent and reporting suicidal thoughts when he was asked to provide urine for a drug screening. He was agitated and psychotic in the ED overnight. UDS in the ED positive for amphetamines and THC.  On assessment today, patient is seen lying in bed calmly. He states he was released from prison last week and going to [facility] under court order for mental health and drug use classes. He states he has history of paranoid schizophrenia- "but that's old stuff. That's not right." He reports anxiety and depression but states mood today is good. He strongly denies SI and denies memory of making suicidal statements. He denies HI. He is alert and oriented x3. He denies any AVH and shows no signs of responding to internal stimuli. He denies paranoia. No paranoid or delusional thought content expressed. Per prior notes, patient was reporting that he was in outer space last night and that his name was "monkey poop." Patient denies memory of making these statements last night. Patient was informed that his admission UDS is positive for amphetamines and THC but still continues to deny substance use. Prior notes indicate alcohol use but admission BAL negative. He reports one episode of drinking since released from prison last week. Nursing confirms he has been calm and appropriate since sleeping overnight.  With patient's expressed consent, I spoke with his sister Leavy Heatherly [phone] whom he lives with. She reports patient began having visual hallucinations of dead relatives two days ago. No suicidal or aggressive behaviors. She states she went with him to [facility] yesterday. She was expecting him to get an outpatient mental health evaluation and does not feel he needs inpatient hospitalization. She denies any safety concerns  for discharge.   Disposition: Patient appears to have presented with substance-induced psychosis/agitation that has cleared at this time. There is no evidence of acute risk of harm to self or others. He is requesting to go home and follow up with [facility]. He is psych cleared for discharge. ED RN and EDP updated.

## 2020-03-23 NOTE — ED Notes (Signed)
TTS in progress at this time.  

## 2020-03-23 NOTE — ED Notes (Signed)
[facility] approved a taxi voucher for the patient to be transported back home.

## 2020-03-23 NOTE — ED Notes (Signed)
Ultrasound at bedside at this time.

## 2020-04-01 ENCOUNTER — Other Ambulatory Visit: Payer: Self-pay

## 2020-04-01 ENCOUNTER — Encounter (HOSPITAL_COMMUNITY): Payer: Self-pay | Admitting: Emergency Medicine

## 2020-04-01 ENCOUNTER — Emergency Department (HOSPITAL_COMMUNITY): Payer: Self-pay

## 2020-04-01 ENCOUNTER — Emergency Department (HOSPITAL_COMMUNITY)
Admission: EM | Admit: 2020-04-01 | Discharge: 2020-04-01 | Disposition: A | Discharge status: Home / Self Care | Payer: Self-pay | Attending: Emergency Medicine | Admitting: Emergency Medicine

## 2020-04-01 DIAGNOSIS — M25552 Pain in left hip: Secondary | ICD-10-CM

## 2020-04-01 DIAGNOSIS — W19XXXA Unspecified fall, initial encounter: Secondary | ICD-10-CM

## 2020-04-01 DIAGNOSIS — S20212A Contusion of left front wall of thorax, initial encounter: Secondary | ICD-10-CM

## 2020-04-01 DIAGNOSIS — Y999 Unspecified external cause status: Secondary | ICD-10-CM | POA: Insufficient documentation

## 2020-04-01 DIAGNOSIS — F17210 Nicotine dependence, cigarettes, uncomplicated: Secondary | ICD-10-CM | POA: Insufficient documentation

## 2020-04-01 DIAGNOSIS — Y92009 Unspecified place in unspecified non-institutional (private) residence as the place of occurrence of the external cause: Secondary | ICD-10-CM | POA: Insufficient documentation

## 2020-04-01 DIAGNOSIS — Y9389 Activity, other specified: Secondary | ICD-10-CM | POA: Insufficient documentation

## 2020-04-01 MED ORDER — CYCLOBENZAPRINE HCL 10 MG PO TABS
10.0000 mg | ORAL_TABLET | Freq: Three times a day (TID) | ORAL | 0 refills | Status: DC | PRN
Start: 2020-04-01 — End: 2020-05-27

## 2020-04-01 MED ORDER — TRAMADOL HCL 50 MG PO TABS: 50.0000 mg | ORAL_TABLET | Freq: Four times a day (QID) | ORAL | 0 refills | Status: DC | PRN
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
## 2020-04-01 NOTE — ED Triage Notes (Signed)
Pt reports family witnessed a fall last night while he was intoxicated; pt c/o left hip pain and left rib pain

## 2020-04-01 NOTE — Discharge Instructions (Addendum)
Apply ice packs on and off to your hip and left chest.  Your x-rays today did not show evidence of broken bones or dislocations.  No punctured lung.  Avoid heavy lifting or straining for several days.  Follow-up with your primary doctor or with the orthopedic provider listed.  Return to emergency department if you develop any worsening symptoms such as sudden shortness of breath

## 2020-04-07 ENCOUNTER — Encounter (HOSPITAL_COMMUNITY): Payer: Self-pay

## 2020-04-07 ENCOUNTER — Emergency Department (HOSPITAL_COMMUNITY)
Admission: EM | Admit: 2020-04-07 | Discharge: 2020-04-08 | Disposition: A | Discharge status: Home / Self Care | Payer: Self-pay | Attending: Emergency Medicine | Admitting: Emergency Medicine

## 2020-04-07 ENCOUNTER — Emergency Department (HOSPITAL_COMMUNITY): Payer: Self-pay

## 2020-04-07 DIAGNOSIS — R319 Hematuria, unspecified: Secondary | ICD-10-CM | POA: Insufficient documentation

## 2020-04-07 DIAGNOSIS — R000 Tachycardia, unspecified: Secondary | ICD-10-CM | POA: Insufficient documentation

## 2020-04-07 DIAGNOSIS — F1590 Other stimulant use, unspecified, uncomplicated: Secondary | ICD-10-CM | POA: Insufficient documentation

## 2020-04-07 DIAGNOSIS — F209 Schizophrenia, unspecified: Secondary | ICD-10-CM | POA: Insufficient documentation

## 2020-04-07 DIAGNOSIS — Z7982 Long term (current) use of aspirin: Secondary | ICD-10-CM | POA: Insufficient documentation

## 2020-04-07 DIAGNOSIS — F17210 Nicotine dependence, cigarettes, uncomplicated: Secondary | ICD-10-CM | POA: Insufficient documentation

## 2020-04-07 DIAGNOSIS — F329 Major depressive disorder, single episode, unspecified: Secondary | ICD-10-CM | POA: Insufficient documentation

## 2020-04-07 DIAGNOSIS — Z79899 Other long term (current) drug therapy: Secondary | ICD-10-CM | POA: Insufficient documentation

## 2020-04-07 DIAGNOSIS — I10 Essential (primary) hypertension: Secondary | ICD-10-CM | POA: Insufficient documentation

## 2020-04-07 HISTORY — DX: Schizophrenia, unspecified: F20.9

## 2020-04-07 HISTORY — DX: Essential (primary) hypertension: I10

## 2020-04-07 HISTORY — DX: Depression, unspecified: F32.A

## 2020-04-07 LAB — CBC
HCT: 45.7 % (ref 39.0–52.0)
Hemoglobin: 15.4 g/dL (ref 13.0–17.0)
MCH: 30.3 pg (ref 26.0–34.0)
MCHC: 33.7 g/dL (ref 30.0–36.0)
MCV: 90 fL (ref 80.0–100.0)
Platelets: 279 10*3/uL (ref 150–400)
RBC: 5.08 MIL/uL (ref 4.22–5.81)
RDW: 12.7 % (ref 11.5–15.5)
WBC: 8.8 10*3/uL (ref 4.0–10.5)
nRBC: 0 % (ref 0.0–0.2)

## 2020-04-07 LAB — URINALYSIS, ROUTINE W REFLEX MICROSCOPIC
Bacteria, UA: NONE SEEN
Bilirubin Urine: NEGATIVE
Glucose, UA: NEGATIVE mg/dL
Ketones, ur: NEGATIVE mg/dL
Leukocytes,Ua: NEGATIVE
Nitrite: NEGATIVE
Protein, ur: NEGATIVE mg/dL
Specific Gravity, Urine: 1.009 (ref 1.005–1.030)
pH: 6 (ref 5.0–8.0)

## 2020-04-07 LAB — COMPREHENSIVE METABOLIC PANEL
ALT: 184 U/L — ABNORMAL HIGH (ref 0–44)
AST: 86 U/L — ABNORMAL HIGH (ref 15–41)
Albumin: 3.8 g/dL (ref 3.5–5.0)
Alkaline Phosphatase: 422 U/L — ABNORMAL HIGH (ref 38–126)
Anion gap: 10 (ref 5–15)
BUN: 15 mg/dL (ref 6–20)
CO2: 27 mmol/L (ref 22–32)
Calcium: 9.1 mg/dL (ref 8.9–10.3)
Chloride: 100 mmol/L (ref 98–111)
Creatinine, Ser: 0.71 mg/dL (ref 0.61–1.24)
GFR calc Af Amer: >60 mL/min (ref >60)
GFR calc non Af Amer: >60 mL/min (ref >60)
Glucose, Bld: 98 mg/dL (ref 70–99)
Potassium: 3.5 mmol/L (ref 3.5–5.1)
Sodium: 137 mmol/L (ref 135–145)
Total Bilirubin: 0.9 mg/dL (ref 0.3–1.2)
Total Protein: 7.1 g/dL (ref 6.5–8.1)

## 2020-04-07 LAB — RAPID URINE DRUG SCREEN, HOSP PERFORMED
Amphetamines: POSITIVE — AB
Barbiturates: NOT DETECTED
Benzodiazepines: NOT DETECTED
Cocaine: NOT DETECTED
Opiates: NOT DETECTED
Tetrahydrocannabinol: POSITIVE — AB

## 2020-04-07 LAB — AMMONIA: Ammonia: 50 umol/L — ABNORMAL HIGH (ref 9–35)

## 2020-04-07 LAB — SALICYLATE LEVEL: Salicylate Lvl: <7 mg/dL — ABNORMAL LOW (ref 7.0–30.0)

## 2020-04-07 LAB — ETHANOL: Alcohol, Ethyl (B): <10 mg/dL (ref <10)

## 2020-04-07 LAB — ACETAMINOPHEN LEVEL: Acetaminophen (Tylenol), Serum: <10 ug/mL — ABNORMAL LOW (ref 10–30)

## 2020-04-07 MED ORDER — ZIPRASIDONE MESYLATE 20 MG IM SOLR
20.0000 mg | Freq: Once | INTRAMUSCULAR | Status: AC
  Administered 2020-04-07: 20 mg via INTRAMUSCULAR
  Filled 2020-04-07: qty 20

## 2020-04-07 MED ORDER — IOHEXOL 300 MG/ML  SOLN
100.0000 mL | Freq: Once | INTRAMUSCULAR | Status: AC | PRN
  Administered 2020-04-07: 100 mL via INTRAVENOUS

## 2020-04-07 MED ORDER — STERILE WATER FOR INJECTION IJ SOLN
INTRAMUSCULAR | Status: AC
  Filled 2020-04-07: qty 10

## 2020-04-07 NOTE — ED Notes (Signed)
Patient placed in scrubs.  Patient's belongings are secured in psych locker.  Patient has been wanded by security.

## 2020-04-07 NOTE — ED Notes (Signed)
Pt continually talking to himself.

## 2020-04-07 NOTE — ED Notes (Signed)
IVC paperwork faxed to [phone], due to [phone] not working
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
## 2020-04-07 NOTE — [facility] Counselor (Signed)
Per Sheralyn Boatman, RN pt is currently sleeping and unable to engage in TTS assessment. Per RN pt was given Geodon. RN to call once pt is able to engage.    Redmond Pulling, MS, [facility], [facility] Triage Specialist [phone]
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
## 2020-04-08 LAB — HEPATIC FUNCTION PANEL
ALT: 124 U/L — ABNORMAL HIGH (ref 0–44)
AST: 44 U/L — ABNORMAL HIGH (ref 15–41)
Albumin: 3.1 g/dL — ABNORMAL LOW (ref 3.5–5.0)
Alkaline Phosphatase: 331 U/L — ABNORMAL HIGH (ref 38–126)
Bilirubin, Direct: 0.2 mg/dL (ref 0.0–0.2)
Indirect Bilirubin: 0.8 mg/dL (ref 0.3–0.9)
Total Bilirubin: 1 mg/dL (ref 0.3–1.2)
Total Protein: 5.9 g/dL — ABNORMAL LOW (ref 6.5–8.1)

## 2020-04-08 MED ORDER — ARIPIPRAZOLE 5 MG PO TABS
5.0000 mg | ORAL_TABLET | Freq: Every day | ORAL | Status: DC
  Filled 2020-04-08: qty 1

## 2020-04-08 MED ORDER — SERTRALINE HCL 50 MG PO TABS: 100.0000 mg | ORAL_TABLET | Freq: Every day | ORAL | Status: DC

## 2020-04-08 MED ORDER — SERTRALINE HCL 50 MG PO TABS
100.0000 mg | ORAL_TABLET | Freq: Every day | ORAL | Status: DC
  Filled 2020-04-08: qty 2

## 2020-04-08 MED ORDER — GABAPENTIN 300 MG PO CAPS
300.0000 mg | ORAL_CAPSULE | Freq: Three times a day (TID) | ORAL | Status: DC
  Administered 2020-04-08: 300 mg via ORAL
  Filled 2020-04-08: qty 1

## 2020-04-08 MED ORDER — GABAPENTIN 300 MG PO CAPS: 300.0000 mg | ORAL_CAPSULE | Freq: Three times a day (TID) | ORAL | Status: DC

## 2020-04-08 NOTE — Discharge Instructions (Addendum)
Follow-up as directed by behavioral health.  Return for new concerns. 
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
## 2020-04-08 NOTE — ED Notes (Signed)
Pt getting TTS 

## 2020-04-08 NOTE — ED Notes (Signed)
Vitals unable to be obtained at this point due to patient sleeping, pt agitated when woken up, will advise AM RN of situation and assess vitals at a later time

## 2020-04-08 NOTE — ED Notes (Signed)
Pt sleeping in room, equal rise & fall of chest noted  

## 2020-05-05 NOTE — ED Provider Notes (Signed)
Call from [facility] pharmacy regarding pt presenting to pick up prescriptions of tramadol which were prescribed during ED visit here on 04/01/20. He did not want the flexeril, just the tramadol prescribed.  This was prescribed for acute pain incurred during a fall, imaging tests were negative.  Advised pharmacist to not fill this medication and to void the prescription, and she agreed with this plan.    Burgess Amor, PA-C 05/05/20 1551    Bethann Berkshire, MD 05/05/20 [phone]

## 2020-05-17 ENCOUNTER — Ambulatory Visit (HOSPITAL_COMMUNITY)
Admission: EM | Admit: 2020-05-17 | Discharge: 2020-05-17 | Disposition: A | Discharge status: Home / Self Care | Payer: Self-pay | Attending: Family Medicine | Admitting: Family Medicine

## 2020-05-17 ENCOUNTER — Encounter (HOSPITAL_COMMUNITY): Payer: Self-pay

## 2020-05-17 ENCOUNTER — Other Ambulatory Visit: Payer: Self-pay

## 2020-05-17 ENCOUNTER — Emergency Department (HOSPITAL_COMMUNITY): Payer: Self-pay

## 2020-05-17 ENCOUNTER — Encounter (HOSPITAL_COMMUNITY): Payer: Self-pay | Admitting: Emergency Medicine

## 2020-05-17 ENCOUNTER — Emergency Department (HOSPITAL_COMMUNITY)
Admission: EM | Admit: 2020-05-17 | Discharge: 2020-05-17 | Disposition: A | Discharge status: Home / Self Care | Payer: Self-pay | Attending: Emergency Medicine | Admitting: Emergency Medicine

## 2020-05-17 DIAGNOSIS — L03114 Cellulitis of left upper limb: Secondary | ICD-10-CM

## 2020-05-17 DIAGNOSIS — I10 Essential (primary) hypertension: Secondary | ICD-10-CM | POA: Insufficient documentation

## 2020-05-17 DIAGNOSIS — M25522 Pain in left elbow: Secondary | ICD-10-CM

## 2020-05-17 DIAGNOSIS — Z79899 Other long term (current) drug therapy: Secondary | ICD-10-CM | POA: Insufficient documentation

## 2020-05-17 DIAGNOSIS — F17210 Nicotine dependence, cigarettes, uncomplicated: Secondary | ICD-10-CM | POA: Insufficient documentation

## 2020-05-17 DIAGNOSIS — Z7982 Long term (current) use of aspirin: Secondary | ICD-10-CM | POA: Insufficient documentation

## 2020-05-17 LAB — CBC WITH DIFFERENTIAL/PLATELET
Abs Immature Granulocytes: 0.05 10*3/uL (ref 0.00–0.07)
Basophils Absolute: 0 10*3/uL (ref 0.0–0.1)
Basophils Relative: 0 %
Eosinophils Absolute: 0.2 10*3/uL (ref 0.0–0.5)
Eosinophils Relative: 2 %
HCT: 41.7 % (ref 39.0–52.0)
Hemoglobin: 14.1 g/dL (ref 13.0–17.0)
Immature Granulocytes: 0 %
Lymphocytes Relative: 15 %
Lymphs Abs: 1.8 10*3/uL (ref 0.7–4.0)
MCH: 29.7 pg (ref 26.0–34.0)
MCHC: 33.8 g/dL (ref 30.0–36.0)
MCV: 88 fL (ref 80.0–100.0)
Monocytes Absolute: 1 10*3/uL (ref 0.1–1.0)
Monocytes Relative: 8 %
Neutro Abs: 9.1 10*3/uL — ABNORMAL HIGH (ref 1.7–7.7)
Neutrophils Relative %: 75 %
Platelets: 246 10*3/uL (ref 150–400)
RBC: 4.74 MIL/uL (ref 4.22–5.81)
RDW: 12.4 % (ref 11.5–15.5)
WBC: 12.2 10*3/uL — ABNORMAL HIGH (ref 4.0–10.5)
nRBC: 0 % (ref 0.0–0.2)

## 2020-05-17 LAB — COMPREHENSIVE METABOLIC PANEL
ALT: 43 U/L (ref 0–44)
AST: 23 U/L (ref 15–41)
Albumin: 4.1 g/dL (ref 3.5–5.0)
Alkaline Phosphatase: 144 U/L — ABNORMAL HIGH (ref 38–126)
Anion gap: 10 (ref 5–15)
BUN: 13 mg/dL (ref 6–20)
CO2: 26 mmol/L (ref 22–32)
Calcium: 8.9 mg/dL (ref 8.9–10.3)
Chloride: 101 mmol/L (ref 98–111)
Creatinine, Ser: 0.84 mg/dL (ref 0.61–1.24)
GFR calc Af Amer: >60 mL/min (ref >60)
GFR calc non Af Amer: >60 mL/min (ref >60)
Glucose, Bld: 125 mg/dL — ABNORMAL HIGH (ref 70–99)
Potassium: 3.3 mmol/L — ABNORMAL LOW (ref 3.5–5.1)
Sodium: 137 mmol/L (ref 135–145)
Total Bilirubin: 0.7 mg/dL (ref 0.3–1.2)
Total Protein: 7.3 g/dL (ref 6.5–8.1)

## 2020-05-17 LAB — URINALYSIS, ROUTINE W REFLEX MICROSCOPIC
Bilirubin Urine: NEGATIVE
Glucose, UA: NEGATIVE mg/dL
Hgb urine dipstick: NEGATIVE
Ketones, ur: NEGATIVE mg/dL
Leukocytes,Ua: NEGATIVE
Nitrite: NEGATIVE
Protein, ur: NEGATIVE mg/dL
Specific Gravity, Urine: 1.019 (ref 1.005–1.030)
pH: 6 (ref 5.0–8.0)

## 2020-05-17 LAB — LACTIC ACID, PLASMA: Lactic Acid, Venous: 1 mmol/L (ref 0.5–1.9)

## 2020-05-17 MED ORDER — CEFTRIAXONE SODIUM 1 G IJ SOLR
INTRAMUSCULAR | Status: AC
  Filled 2020-05-17: qty 10

## 2020-05-17 MED ORDER — DOXYCYCLINE HYCLATE 100 MG PO CAPS: 100.0000 mg | ORAL_CAPSULE | Freq: Two times a day (BID) | ORAL | 0 refills | Status: DC

## 2020-05-17 MED ORDER — LIDOCAINE HCL (PF) 1 % IJ SOLN
INTRAMUSCULAR | Status: AC
  Filled 2020-05-17: qty 2

## 2020-05-17 MED ORDER — CEFTRIAXONE SODIUM 1 G IJ SOLR
1.0000 g | Freq: Once | INTRAMUSCULAR | Status: AC
  Administered 2020-05-17: 1 g via INTRAMUSCULAR

## 2020-05-17 MED ORDER — DOXYCYCLINE HYCLATE 100 MG PO TABS: 100.0000 mg | ORAL_TABLET | Freq: Once | ORAL | Status: DC

## 2020-05-17 MED ORDER — HYDROCODONE-ACETAMINOPHEN 5-325 MG PO TABS
ORAL_TABLET | ORAL | Status: AC
  Filled 2020-05-17: qty 1

## 2020-05-17 MED ORDER — HYDROCODONE-ACETAMINOPHEN 5-325 MG PO TABS
1.0000 | ORAL_TABLET | Freq: Once | ORAL | Status: AC
  Administered 2020-05-17: 1 via ORAL

## 2020-05-17 MED ORDER — ACETAMINOPHEN 500 MG PO TABS
1000.0000 mg | ORAL_TABLET | Freq: Once | ORAL | Status: AC
  Administered 2020-05-17: 1000 mg via ORAL
  Filled 2020-05-17: qty 2

## 2020-05-17 NOTE — ED Triage Notes (Signed)
Pt presents with left elbow pain X 3 days. Elbow area warm, red, swollen and tender to touch.  Pt appears drunk or the influence of something; pt having conversations with his self during triage.

## 2020-05-17 NOTE — ED Notes (Signed)
Pt would like shot of antibiotics and something for pain: pt states he does not have transportation to pharmacy to get medication.

## 2020-05-17 NOTE — Discharge Instructions (Signed)
Take antibiotics as directed. Please take all of your antibiotics until finished.  Return to emergency department if the redness or swelling begins to spread, you have fevers, difficulty moving your arm and or fingers, or any other worsening concerning symptoms.

## 2020-05-17 NOTE — ED Triage Notes (Signed)
Pt c/o severe L elbow pain x 2 days, joint warm, red, swollen and tender. Pt denies injury
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
## 2020-05-17 NOTE — Discharge Instructions (Addendum)
Pick up the antibiotic prescribed by the Emergency Department as soon as you can get a ride to your pharmacy. It is very important that you begin taking this as soon as possible.
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
## 2020-05-23 ENCOUNTER — Emergency Department (HOSPITAL_COMMUNITY)
Admission: EM | Admit: 2020-05-23 | Discharge: 2020-05-24 | Disposition: A | Discharge status: Other Acute Inpatient Hospital | Payer: Self-pay | Attending: Emergency Medicine | Admitting: Emergency Medicine

## 2020-05-23 ENCOUNTER — Other Ambulatory Visit: Payer: Self-pay

## 2020-05-23 ENCOUNTER — Encounter (HOSPITAL_COMMUNITY): Payer: Self-pay

## 2020-05-23 DIAGNOSIS — F209 Schizophrenia, unspecified: Secondary | ICD-10-CM | POA: Insufficient documentation

## 2020-05-23 DIAGNOSIS — F17210 Nicotine dependence, cigarettes, uncomplicated: Secondary | ICD-10-CM | POA: Insufficient documentation

## 2020-05-23 DIAGNOSIS — Z7982 Long term (current) use of aspirin: Secondary | ICD-10-CM | POA: Insufficient documentation

## 2020-05-23 DIAGNOSIS — I10 Essential (primary) hypertension: Secondary | ICD-10-CM | POA: Insufficient documentation

## 2020-05-23 DIAGNOSIS — F22 Delusional disorders: Secondary | ICD-10-CM | POA: Insufficient documentation

## 2020-05-23 DIAGNOSIS — F1590 Other stimulant use, unspecified, uncomplicated: Secondary | ICD-10-CM | POA: Insufficient documentation

## 2020-05-23 DIAGNOSIS — Z79899 Other long term (current) drug therapy: Secondary | ICD-10-CM | POA: Insufficient documentation

## 2020-05-23 DIAGNOSIS — Z20822 Contact with and (suspected) exposure to covid-19: Secondary | ICD-10-CM | POA: Insufficient documentation

## 2020-05-23 LAB — COMPREHENSIVE METABOLIC PANEL
ALT: 43 U/L (ref 0–44)
AST: 24 U/L (ref 15–41)
Albumin: 3.5 g/dL (ref 3.5–5.0)
Alkaline Phosphatase: 179 U/L — ABNORMAL HIGH (ref 38–126)
Anion gap: 7 (ref 5–15)
BUN: 14 mg/dL (ref 6–20)
CO2: 27 mmol/L (ref 22–32)
Calcium: 8.8 mg/dL — ABNORMAL LOW (ref 8.9–10.3)
Chloride: 102 mmol/L (ref 98–111)
Creatinine, Ser: 0.79 mg/dL (ref 0.61–1.24)
GFR, Estimated: >60 mL/min (ref >60)
Glucose, Bld: 141 mg/dL — ABNORMAL HIGH (ref 70–99)
Potassium: 3.8 mmol/L (ref 3.5–5.1)
Sodium: 136 mmol/L (ref 135–145)
Total Bilirubin: 0.4 mg/dL (ref 0.3–1.2)
Total Protein: 7.1 g/dL (ref 6.5–8.1)

## 2020-05-23 LAB — CBC WITH DIFFERENTIAL/PLATELET
Abs Immature Granulocytes: 0.07 10*3/uL (ref 0.00–0.07)
Basophils Absolute: 0.1 10*3/uL (ref 0.0–0.1)
Basophils Relative: 1 %
Eosinophils Absolute: 0.3 10*3/uL (ref 0.0–0.5)
Eosinophils Relative: 4 %
HCT: 40.3 % (ref 39.0–52.0)
Hemoglobin: 13.8 g/dL (ref 13.0–17.0)
Immature Granulocytes: 1 %
Lymphocytes Relative: 23 %
Lymphs Abs: 1.6 10*3/uL (ref 0.7–4.0)
MCH: 30.6 pg (ref 26.0–34.0)
MCHC: 34.2 g/dL (ref 30.0–36.0)
MCV: 89.4 fL (ref 80.0–100.0)
Monocytes Absolute: 0.4 10*3/uL (ref 0.1–1.0)
Monocytes Relative: 5 %
Neutro Abs: 4.5 10*3/uL (ref 1.7–7.7)
Neutrophils Relative %: 66 %
Platelets: 282 10*3/uL (ref 150–400)
RBC: 4.51 MIL/uL (ref 4.22–5.81)
RDW: 12.1 % (ref 11.5–15.5)
WBC: 6.8 10*3/uL (ref 4.0–10.5)
nRBC: 0 % (ref 0.0–0.2)

## 2020-05-23 LAB — RAPID URINE DRUG SCREEN, HOSP PERFORMED
Amphetamines: NOT DETECTED
Barbiturates: NOT DETECTED
Benzodiazepines: NOT DETECTED
Cocaine: NOT DETECTED
Opiates: NOT DETECTED
Tetrahydrocannabinol: POSITIVE — AB

## 2020-05-23 LAB — ETHANOL: Alcohol, Ethyl (B): <10 mg/dL (ref <10)

## 2020-05-23 LAB — SALICYLATE LEVEL: Salicylate Lvl: <7 mg/dL — ABNORMAL LOW (ref 7.0–30.0)

## 2020-05-23 LAB — ACETAMINOPHEN LEVEL: Acetaminophen (Tylenol), Serum: <10 ug/mL — ABNORMAL LOW (ref 10–30)

## 2020-05-23 NOTE — BH Assessment (Signed)
Comprehensive Clinical Assessment (CCA) Screening, Triage and Referral Note  05/24/2020 Bradley Mcgrath [id number]   Bradley Mcgrath is a 39 year old male presenting under IVC due to bizarre behaviors and hallucinations. Patient stated "I don't know why I am here, I was sleep, when I woke up the police came and brought me here".  Patient not forthcoming with any information. During entire assessment patient was responding to stimuli. Patient was reaching into air as if he was grabbing food from the air and putting it up to his mouth and eating secretly. There was no food in his hands. Patient continued to look on both sides of himself and mumbling words. Patient denied SI, HI, psychosis and alcohol/drug usage. Patient IVC papers state he is bipolar and has stopped taking his meds, walks around seeing dragons and snakes, says he is deteriorating each day and they think he is likely to harm himself or a family member if he doesn't receive help. Patient denied prior psych inpatient treatment, suicidal attempts, self-harming behaviors and psychosis. Patient reported 6-8 hours sleep and normal appetite.   Patient reported living with sister, sisters boyfriend and niece. Patient was in jail 3-4 months ago due to drug charges. Patient does not have a job and has no access to guns.   Disposition: Nira Conn, NP, patient meets inpatient criteria. [name], [facility], currently in review for placement. Disposition SW will follow up with securing placement.   Visit Diagnosis:    ICD-10-CM   1. Schizophrenia, unspecified type (HCC)  F20.9     Patient Reported Information How did you hear about [place]? Family/Friend   Referral name: No data recorded  Referral phone number: No data recorded Whom do you see for routine medical problems? No data recorded  Practice/Facility Name: No data recorded  Practice/Facility Phone Number: No data recorded  Name of Contact: No data recorded  Contact Number: No data recorded  Contact Fax  Number: No data recorded  Prescriber Name: No data recorded  Prescriber Address (if known): No data recorded What Is the Reason for Your Visit/Call Today? Patient is under IVC  How Long Has This Been Causing You Problems? No data recorded Have You Recently Been in Any Inpatient Treatment (Hospital/Detox/Crisis Center/28-Day Program)? No   Name/Location of Program/Hospital:No data recorded  How Long Were You There? No data recorded  When Were You Discharged? No data recorded Have You Ever Received Services From [facility] Before? No   Who Do You See at [facility]? No data recorded Have You Recently Had Any Thoughts About Hurting Yourself? No   Are You Planning to Commit Suicide/Harm Yourself At This time?  No  Have you Recently Had Thoughts About Hurting Someone Karolee Ohs? No   Explanation: No data recorded Have You Used Any Alcohol or Drugs in the Past 24 Hours? No   How Long Ago Did You Use Drugs or Alcohol?  No data recorded  What Did You Use and How Much? No data recorded What Do You Feel Would Help You the Most Today? Other (Comment) (none)  Do You Currently Have a Therapist/Psychiatrist? No   Name of Therapist/Psychiatrist: No data recorded  Have You Been Recently Discharged From Any Office Practice or Programs? No   Explanation of Discharge From Practice/Program:  No data recorded    CCA Screening Triage Referral Assessment Type of Contact: Tele-Assessment   Is this Initial or Reassessment? Initial Assessment   Date Telepsych consult ordered in [facility]:  05/23/20   Time Telepsych consult  ordered in [facility]:  1708  Patient Reported Information Reviewed? Yes   Patient Left Without Being Seen? No data recorded  Reason for Not Completing Assessment: No data recorded Collateral Involvement: none reported  Does Patient Have a Court Appointed Legal Guardian? No data recorded  Name and Contact of Legal Guardian:  No data recorded If Minor and Not Living with Parent(s), Who has  Custody? No data recorded Is CPS involved or ever been involved? Never  Is APS involved or ever been involved? Never  Patient Determined To Be At Risk for Harm To Self or Others Based on Review of Patient Reported Information or Presenting Complaint? No   Method: No data recorded  Availability of Means: No data recorded  Intent: No data recorded  Notification Required: No data recorded  Additional Information for Danger to Others Potential:  No data recorded  Additional Comments for Danger to Others Potential:  No data recorded  Are There Guns or Other Weapons in Your Home?  No data recorded   Types of Guns/Weapons: No data recorded   Are These Weapons Safely Secured?                              No data recorded   Who Could Verify You Are Able To Have These Secured:    No data recorded Do You Have any Outstanding Charges, Pending Court Dates, Parole/Probation? No data recorded Contacted To Inform of Risk of Harm To Self or Others: No data recorded Location of Assessment: [facility] ED  Does Patient Present under Involuntary Commitment? Yes   IVC Papers Initial File Date: 05/23/20   [place] of Residence: [place]  Patient Currently Receiving the Following Services: Not Receiving Services   Determination of Need: Emergent (2 hours)   Options For Referral: Inpatient Hospitalization;Medication Management;Outpatient Therapy   Burnetta Sabin, [facility]
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
## 2020-05-23 NOTE — ED Notes (Signed)
Pt wanded by security since changing into burgundy scrubs.

## 2020-05-23 NOTE — ED Triage Notes (Signed)
Pt brought to ED under IVC by [employer]. IVC papers state he is bipolar and has stopped taking his meds, walks around seeing dragons and snakes, says he is deteriorating each day and they think he is likely to harm himself or a family member if he doesn't receive help. Pt denies SI/HI

## 2020-05-24 ENCOUNTER — Encounter (HOSPITAL_COMMUNITY): Payer: Self-pay | Admitting: Psychiatry

## 2020-05-24 ENCOUNTER — Other Ambulatory Visit: Payer: Self-pay

## 2020-05-24 ENCOUNTER — Inpatient Hospital Stay (HOSPITAL_COMMUNITY)
Admission: AD | Admit: 2020-05-24 | Discharge: 2020-05-27 | DRG: 885 | Disposition: A | Discharge status: Home / Self Care | Payer: Federal, State, Local not specified - Other | Source: Intra-hospital | Attending: Psychiatry | Admitting: Psychiatry

## 2020-05-24 DIAGNOSIS — I10 Essential (primary) hypertension: Secondary | ICD-10-CM | POA: Diagnosis present

## 2020-05-24 DIAGNOSIS — F1510 Other stimulant abuse, uncomplicated: Secondary | ICD-10-CM | POA: Diagnosis not present

## 2020-05-24 DIAGNOSIS — F17200 Nicotine dependence, unspecified, uncomplicated: Secondary | ICD-10-CM | POA: Diagnosis present

## 2020-05-24 DIAGNOSIS — F4320 Adjustment disorder, unspecified: Secondary | ICD-10-CM | POA: Diagnosis present

## 2020-05-24 DIAGNOSIS — F4310 Post-traumatic stress disorder, unspecified: Secondary | ICD-10-CM | POA: Diagnosis present

## 2020-05-24 DIAGNOSIS — F1010 Alcohol abuse, uncomplicated: Secondary | ICD-10-CM | POA: Diagnosis not present

## 2020-05-24 DIAGNOSIS — F1190 Opioid use, unspecified, uncomplicated: Secondary | ICD-10-CM | POA: Diagnosis present

## 2020-05-24 DIAGNOSIS — F1220 Cannabis dependence, uncomplicated: Secondary | ICD-10-CM | POA: Diagnosis present

## 2020-05-24 DIAGNOSIS — F17210 Nicotine dependence, cigarettes, uncomplicated: Secondary | ICD-10-CM | POA: Diagnosis present

## 2020-05-24 DIAGNOSIS — Z9114 Patient's other noncompliance with medication regimen: Secondary | ICD-10-CM

## 2020-05-24 DIAGNOSIS — F1910 Other psychoactive substance abuse, uncomplicated: Secondary | ICD-10-CM | POA: Clinically undetermined

## 2020-05-24 DIAGNOSIS — F209 Schizophrenia, unspecified: Principal | ICD-10-CM | POA: Diagnosis present

## 2020-05-24 DIAGNOSIS — G4700 Insomnia, unspecified: Secondary | ICD-10-CM | POA: Diagnosis present

## 2020-05-24 DIAGNOSIS — F29 Unspecified psychosis not due to a substance or known physiological condition: Secondary | ICD-10-CM | POA: Diagnosis present

## 2020-05-24 DIAGNOSIS — F200 Paranoid schizophrenia: Secondary | ICD-10-CM | POA: Diagnosis not present

## 2020-05-24 DIAGNOSIS — F319 Bipolar disorder, unspecified: Secondary | ICD-10-CM | POA: Diagnosis present

## 2020-05-24 DIAGNOSIS — F19959 Other psychoactive substance use, unspecified with psychoactive substance-induced psychotic disorder, unspecified: Secondary | ICD-10-CM | POA: Diagnosis present

## 2020-05-24 DIAGNOSIS — F15159 Other stimulant abuse with stimulant-induced psychotic disorder, unspecified: Secondary | ICD-10-CM | POA: Diagnosis present

## 2020-05-24 DIAGNOSIS — K219 Gastro-esophageal reflux disease without esophagitis: Secondary | ICD-10-CM | POA: Diagnosis present

## 2020-05-24 DIAGNOSIS — Z79899 Other long term (current) drug therapy: Secondary | ICD-10-CM

## 2020-05-24 DIAGNOSIS — F17211 Nicotine dependence, cigarettes, in remission: Secondary | ICD-10-CM | POA: Diagnosis not present

## 2020-05-24 DIAGNOSIS — R1013 Epigastric pain: Secondary | ICD-10-CM

## 2020-05-24 DIAGNOSIS — R748 Abnormal levels of other serum enzymes: Secondary | ICD-10-CM | POA: Diagnosis present

## 2020-05-24 DIAGNOSIS — F411 Generalized anxiety disorder: Secondary | ICD-10-CM | POA: Diagnosis present

## 2020-05-24 LAB — RESPIRATORY PANEL BY RT PCR (FLU A&B, COVID)
Influenza A by PCR: NEGATIVE
Influenza B by PCR: NEGATIVE
SARS Coronavirus 2 by RT PCR: NEGATIVE

## 2020-05-24 MED ORDER — MAGNESIUM HYDROXIDE 400 MG/5ML PO SUSP: 30.0000 mL | Freq: Every day | ORAL | Status: DC | PRN

## 2020-05-24 MED ORDER — ZIPRASIDONE MESYLATE 20 MG IM SOLR
15.0000 mg | Freq: Once | INTRAMUSCULAR | Status: AC
  Administered 2020-05-24: 15 mg via INTRAMUSCULAR
  Filled 2020-05-24: qty 20

## 2020-05-24 MED ORDER — STERILE WATER FOR INJECTION IJ SOLN
INTRAMUSCULAR | Status: AC
  Filled 2020-05-24: qty 10

## 2020-05-24 MED ORDER — ALUM & MAG HYDROXIDE-SIMETH 200-200-20 MG/5ML PO SUSP: 30.0000 mL | ORAL | Status: DC | PRN

## 2020-05-24 MED ORDER — HYDROXYZINE HCL 25 MG PO TABS
25.0000 mg | ORAL_TABLET | Freq: Three times a day (TID) | ORAL | Status: DC | PRN
  Administered 2020-05-24 – 2020-05-26 (×3): 25 mg via ORAL
  Filled 2020-05-24 (×2): qty 1
  Filled 2020-05-24: qty 10
  Filled 2020-05-24: qty 1

## 2020-05-24 MED ORDER — TRAZODONE HCL 50 MG PO TABS
50.0000 mg | ORAL_TABLET | Freq: Every evening | ORAL | Status: DC | PRN
  Administered 2020-05-26: 50 mg via ORAL
  Filled 2020-05-24: qty 7
  Filled 2020-05-24: qty 1

## 2020-05-24 MED ORDER — RISPERIDONE 2 MG PO TBDP
2.0000 mg | ORAL_TABLET | Freq: Once | ORAL | Status: AC
  Administered 2020-05-24: 2 mg via ORAL
  Filled 2020-05-24 (×2): qty 1

## 2020-05-24 MED ORDER — ACETAMINOPHEN 325 MG PO TABS
650.0000 mg | ORAL_TABLET | Freq: Four times a day (QID) | ORAL | Status: DC | PRN
  Administered 2020-05-25 – 2020-05-26 (×3): 650 mg via ORAL
  Filled 2020-05-24 (×3): qty 2

## 2020-05-24 NOTE — ED Notes (Signed)
Sister given a update

## 2020-05-24 NOTE — ED Notes (Signed)
Pt ate most of his bfast. Is now asleep.

## 2020-05-24 NOTE — Progress Notes (Signed)
Pt accepted to [facility], bed 502-2  Denzil Magnuson, NP is the accepting provider.    Dr. Jola Babinski is the attending provider.    Call report to [phone]    [facility] @ [facility] ED notified.     Pt is scheduled to arrive at [facility] at 9pm.    Wells Guiles, MSW, LCSW, LCAS Clinical Social Worker II Disposition CSW [phone]

## 2020-05-24 NOTE — ED Notes (Signed)
Pt accepted to [facility], bed 502-2  Denzil Magnuson, NP is the accepting provider.    Dr. Jola Babinski is the attending provider.    Call report to [phone]    Pt is scheduled to arrive at [facility] at 9pm.

## 2020-05-24 NOTE — ED Notes (Signed)
Boots taken off pt. 10.00 bill in one shoe and a small bag of white substance in the other shoe. Pt tried to reach for the baggy but nurse was able to get it. [employer] was given baggy with white substance, Shoes and $10.00 in locker.

## 2020-05-24 NOTE — BH Assessment (Signed)
Bradley Conn, Bradley Mcgrath, patient meets inpatient criteria. [name], [facility], currently in review for placement. Disposition SW will follow up with securing placement.

## 2020-05-25 DIAGNOSIS — F200 Paranoid schizophrenia: Secondary | ICD-10-CM

## 2020-05-25 MED ORDER — METHOCARBAMOL 500 MG PO TABS
500.0000 mg | ORAL_TABLET | Freq: Three times a day (TID) | ORAL | Status: DC | PRN
  Administered 2020-05-25 – 2020-05-27 (×4): 500 mg via ORAL
  Filled 2020-05-25 (×4): qty 1

## 2020-05-25 MED ORDER — RISPERIDONE 2 MG PO TBDP: 2.0000 mg | ORAL_TABLET | Freq: Three times a day (TID) | ORAL | Status: DC | PRN

## 2020-05-25 MED ORDER — LORAZEPAM 1 MG PO TABS
1.0000 mg | ORAL_TABLET | ORAL | Status: AC | PRN
  Administered 2020-05-26: 1 mg via ORAL
  Filled 2020-05-25: qty 1

## 2020-05-25 MED ORDER — ONDANSETRON 4 MG PO TBDP: 4.0000 mg | ORAL_TABLET | Freq: Four times a day (QID) | ORAL | Status: DC | PRN

## 2020-05-25 MED ORDER — RISPERIDONE 1 MG PO TBDP
1.0000 mg | ORAL_TABLET | Freq: Every day | ORAL | Status: DC
  Administered 2020-05-25 – 2020-05-26 (×2): 1 mg via ORAL
  Filled 2020-05-25 (×4): qty 1

## 2020-05-25 MED ORDER — CLONIDINE HCL 0.1 MG PO TABS
0.1000 mg | ORAL_TABLET | Freq: Every day | ORAL | Status: DC
  Filled 2020-05-25: qty 1

## 2020-05-25 MED ORDER — LOPERAMIDE HCL 2 MG PO CAPS: 2.0000 mg | ORAL_CAPSULE | ORAL | Status: DC | PRN

## 2020-05-25 MED ORDER — DICYCLOMINE HCL 20 MG PO TABS
20.0000 mg | ORAL_TABLET | Freq: Four times a day (QID) | ORAL | Status: DC | PRN
  Administered 2020-05-26 – 2020-05-27 (×3): 20 mg via ORAL
  Filled 2020-05-25 (×3): qty 1

## 2020-05-25 MED ORDER — CLONIDINE HCL 0.1 MG PO TABS
0.1000 mg | ORAL_TABLET | Freq: Four times a day (QID) | ORAL | Status: AC
  Administered 2020-05-25 – 2020-05-26 (×7): 0.1 mg via ORAL
  Filled 2020-05-25 (×9): qty 1

## 2020-05-25 MED ORDER — NAPROXEN 500 MG PO TABS
500.0000 mg | ORAL_TABLET | Freq: Two times a day (BID) | ORAL | Status: DC | PRN
  Administered 2020-05-25 – 2020-05-27 (×4): 500 mg via ORAL
  Filled 2020-05-25 (×4): qty 1

## 2020-05-25 MED ORDER — ZIPRASIDONE MESYLATE 20 MG IM SOLR: 20.0000 mg | INTRAMUSCULAR | Status: DC | PRN

## 2020-05-25 MED ORDER — CLONIDINE HCL 0.1 MG PO TABS
0.1000 mg | ORAL_TABLET | ORAL | Status: DC
  Administered 2020-05-27: 0.1 mg via ORAL
  Filled 2020-05-25 (×4): qty 1

## 2020-05-25 MED ORDER — RISPERIDONE 2 MG PO TBDP
3.0000 mg | ORAL_TABLET | Freq: Every day | ORAL | Status: DC
  Administered 2020-05-25: 3 mg via ORAL
  Filled 2020-05-25 (×2): qty 1

## 2020-05-25 MED ORDER — RISPERIDONE 2 MG PO TBDP
2.0000 mg | ORAL_TABLET | Freq: Every day | ORAL | Status: DC
  Filled 2020-05-25 (×2): qty 1

## 2020-05-25 NOTE — Progress Notes (Signed)
Pt was responding to internal stimuli during admission , even though pt tried to appear as if nothing was going on, but every time writer looked away pt appeared to be looking and sometimes talking to people not there.

## 2020-05-25 NOTE — [facility] Group Notes (Signed)
Pt did not attend orientation group.  

## 2020-05-25 NOTE — Tx Team (Signed)
Initial Treatment Plan 05/25/2020 3:53 AM Bradley Mcgrath [id number]    PATIENT STRESSORS: Marital or family conflict Medication change or noncompliance   PATIENT STRENGTHS: General fund of knowledge Motivation for treatment/growth   PATIENT IDENTIFIED PROBLEMS: psychosis  Med non-compliance  "eating and sleeping"                 DISCHARGE CRITERIA:  Improved stabilization in mood, thinking, and/or behavior Verbal commitment to aftercare and medication compliance  PRELIMINARY DISCHARGE PLAN: Attend aftercare/continuing care group Attend 12-step recovery group Outpatient therapy  PATIENT/FAMILY INVOLVEMENT: This treatment plan has been presented to and reviewed with the patient, [name].  The patient and family have been given the opportunity to ask questions and make suggestions.  Delos Haring, RN 05/25/2020, 3:53 AM
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
## 2020-05-25 NOTE — [facility] Group Notes (Signed)
°  [facility] LCSW Group Therapy  05/25/2020 2:52 PM   Type of Therapy and Topic: Group Therapy: Self-Care  Type of Therapy:  Psychoeducational Skills  Participation Level:  Did Not Attend  Participation Quality:  None  Affect:  None  Cognitive:  None  Insight:  None  Engagement in Therapy:  None  Modes of Intervention:  Discussion and Education  Summary of Progress/Problems: Patient was invited to attend group by CSW, however this patient chose not to attend.  Edahi Kroening A Jaynie Hitch 05/25/2020, 2:52 PM

## 2020-05-25 NOTE — Progress Notes (Signed)
Pt keeps to his room, has very minimal interaction on the unit this evening. Pt denies SI/ HI/ AVH , but appears to have something going on     05/25/20 2300  Psych Admission Type (Psych Patients Only)  Admission Status Involuntary  Psychosocial Assessment  Patient Complaints Anxiety;Suspiciousness;Worrying  Eye Contact Fair  Facial Expression Anxious  Affect Anxious  Speech Logical/coherent  Interaction Assertive  Motor Activity Restless  Appearance/Hygiene Disheveled  Behavior Characteristics Cooperative  Mood Preoccupied  Aggressive Behavior  Effect No apparent injury  Thought Process  Coherency Circumstantial  Content Blaming others  Delusions WDL  Perception WDL  Hallucination Auditory  Judgment Impaired  Confusion WDL  Danger to Self  Current suicidal ideation? Denies  Danger to Others  Danger to Others None reported or observed

## 2020-05-25 NOTE — Tx Team (Signed)
Interdisciplinary Treatment and Diagnostic Plan Update  05/25/2020 Time of Session: 9:40am Bradley Mcgrath MRN: [id number]  Principal Diagnosis: <principal problem not specified>  Secondary Diagnoses: Active Problems:   Schizophrenia (HCC)   Current Medications:  Current Facility-Administered Medications  Medication Dose Route Frequency Provider Last Rate Last Admin  . acetaminophen (TYLENOL) tablet 650 mg  650 mg Oral Q6H PRN Nira Conn A, NP      . alum & mag hydroxide-simeth (MAALOX/MYLANTA) 200-200-20 MG/5ML suspension 30 mL  30 mL Oral Q4H PRN Nira Conn A, NP      . cloNIDine (CATAPRES) tablet 0.1 mg  0.1 mg Oral QID Antonieta Pert, MD       Followed by  . [START ON 05/27/2020] cloNIDine (CATAPRES) tablet 0.1 mg  0.1 mg Oral BH-qamhs Clary, Marlane Mingle, MD       Followed by  . [START ON 05/29/2020] cloNIDine (CATAPRES) tablet 0.1 mg  0.1 mg Oral QAC breakfast Antonieta Pert, MD      . dicyclomine (BENTYL) tablet 20 mg  20 mg Oral Q6H PRN Antonieta Pert, MD      . hydrOXYzine (ATARAX/VISTARIL) tablet 25 mg  25 mg Oral TID PRN Jackelyn Poling, NP   25 mg at 05/24/20 2349  . loperamide (IMODIUM) capsule 2-4 mg  2-4 mg Oral PRN Antonieta Pert, MD      . risperiDONE (RISPERDAL M-TABS) disintegrating tablet 2 mg  2 mg Oral Q8H PRN Antonieta Pert, MD       And  . LORazepam (ATIVAN) tablet 1 mg  1 mg Oral PRN Antonieta Pert, MD       And  . ziprasidone (GEODON) injection 20 mg  20 mg Intramuscular PRN Antonieta Pert, MD      . magnesium hydroxide (MILK OF MAGNESIA) suspension 30 mL  30 mL Oral Daily PRN Nira Conn A, NP      . methocarbamol (ROBAXIN) tablet 500 mg  500 mg Oral Q8H PRN Antonieta Pert, MD      . naproxen (NAPROSYN) tablet 500 mg  500 mg Oral BID PRN Antonieta Pert, MD      . ondansetron (ZOFRAN-ODT) disintegrating tablet 4 mg  4 mg Oral Q6H PRN Antonieta Pert, MD      . risperiDONE (RISPERDAL M-TABS) disintegrating tablet 1 mg   1 mg Oral Daily Antonieta Pert, MD      . risperiDONE (RISPERDAL M-TABS) disintegrating tablet 2 mg  2 mg Oral QHS Antonieta Pert, MD      . traZODone (DESYREL) tablet 50 mg  50 mg Oral QHS PRN Jackelyn Poling, NP       PTA Medications: Medications Prior to Admission  Medication Sig Dispense Refill Last Dose  . Aspirin-Salicylamide-Caffeine (ARTHRITIS STRENGTH BC POWDER PO) Take 1 packet by mouth daily as needed (pain).     . clonazePAM (KLONOPIN) 0.5 MG tablet Take 1 tablet (0.5 mg total) by mouth 2 (two) times daily as needed for anxiety. 60 tablet 0   . cyclobenzaprine (FLEXERIL) 10 MG tablet Take 1 tablet (10 mg total) by mouth 3 (three) times daily as needed. 21 tablet 0   . [EXPIRED] doxycycline (VIBRAMYCIN) 100 MG capsule Take 1 capsule (100 mg total) by mouth 2 (two) times daily for 7 days. 14 capsule 0   . gabapentin (NEURONTIN) 300 MG capsule Take 1 capsule (300 mg total) by mouth 3 (three) times daily. 90 capsule 3   .  omeprazole (PRILOSEC) 40 MG capsule Take 1 capsule (40 mg total) by mouth daily. 30 capsule 3   . sertraline (ZOLOFT) 100 MG tablet Take 1 tablet (100 mg total) by mouth daily. 30 tablet 3   . traMADol (ULTRAM) 50 MG tablet Take 1 tablet (50 mg total) by mouth every 6 (six) hours as needed. 10 tablet 0   . ursodiol (ACTIGALL) 250 MG tablet Take 1 tablet (250 mg total) by mouth 3 (three) times daily. (Patient not taking: Reported on 05/23/2020) 90 tablet 5     Patient Stressors: Marital or family conflict Medication change or noncompliance  Patient Strengths: Technical sales engineer for treatment/growth  Treatment Modalities: Medication Management, Group therapy, Case management,  1 to 1 session with clinician, Psychoeducation, Recreational therapy.   Physician Treatment Plan for Primary Diagnosis: <principal problem not specified> Long Term Goal(s):     Short Term Goals:    Medication Management: Evaluate patient's response, side effects,  and tolerance of medication regimen.  Therapeutic Interventions: 1 to 1 sessions, Unit Group sessions and Medication administration.  Evaluation of Outcomes: Not Met  Physician Treatment Plan for Secondary Diagnosis: Active Problems:   Schizophrenia ([place])  Long Term Goal(s):     Short Term Goals:       Medication Management: Evaluate patient's response, side effects, and tolerance of medication regimen.  Therapeutic Interventions: 1 to 1 sessions, Unit Group sessions and Medication administration.  Evaluation of Outcomes: Not Met   RN Treatment Plan for Primary Diagnosis: <principal problem not specified> Long Term Goal(s): Knowledge of disease and therapeutic regimen to maintain health will improve  Short Term Goals: Ability to remain free from injury will improve, Ability to verbalize frustration and anger appropriately will improve, Ability to identify and develop effective coping behaviors will improve and Compliance with prescribed medications will improve  Medication Management: RN will administer medications as ordered by provider, will assess and evaluate patient's response and provide education to patient for prescribed medication. RN will report any adverse and/or side effects to prescribing provider.  Therapeutic Interventions: 1 on 1 counseling sessions, Psychoeducation, Medication administration, Evaluate responses to treatment, Monitor vital signs and CBGs as ordered, Perform/monitor CIWA, COWS, AIMS and Fall Risk screenings as ordered, Perform wound care treatments as ordered.  Evaluation of Outcomes: Not Met   LCSW Treatment Plan for Primary Diagnosis: <principal problem not specified> Long Term Goal(s): Safe transition to appropriate next level of care at discharge, Engage patient in therapeutic group addressing interpersonal concerns.  Short Term Goals: Engage patient in aftercare planning with referrals and resources, Increase social support, Facilitate patient  progression through stages of change regarding substance use diagnoses and concerns, Identify triggers associated with mental health/substance abuse issues and Increase skills for wellness and recovery  Therapeutic Interventions: Assess for all discharge needs, 1 to 1 time with Social worker, Explore available resources and support systems, Assess for adequacy in community support network, Educate family and significant other(s) on suicide prevention, Complete Psychosocial Assessment, Interpersonal group therapy.  Evaluation of Outcomes: Not Met   Progress in Treatment: Attending groups: No. Participating in groups: No. Taking medication as prescribed: Yes. Toleration medication: Yes. Family/Significant other contact made: No, will contact:  if consent is provided Patient understands diagnosis: Yes. Discussing patient identified problems/goals with staff: Yes. Medical problems stabilized or resolved: Yes. Denies suicidal/homicidal ideation: Yes. Issues/concerns per patient self-inventory: No.   New problem(s) identified: No, Describe:  none  New Short Term/Long Term Goal(s): medication stabilization, elimination of SI  thoughts, development of comprehensive mental wellness plan.   Patient Goals:  "To go home"  Discharge Plan or Barriers: Patient recently admitted. CSW will continue to follow and assess for appropriate referrals and possible discharge planning.   Reason for Continuation of Hospitalization: Delusions  Hallucinations Medication stabilization  Estimated Length of Stay: 3-5 days  Attendees: Patient: Ward Boissonneault 05/25/2020  Physician: Harriett Sine, NP 05/25/2020   Nursing:  05/25/2020  RN Care Manager: 05/25/2020   Social Worker: Darletta Moll, LCSW 05/25/2020  Recreational Therapist:  05/25/2020   Other:  05/25/2020   Other:  05/25/2020  Other: 05/25/2020       Scribe for Treatment Team: Vassie Moselle, LCSW 05/25/2020 10:23 AM

## 2020-05-25 NOTE — Progress Notes (Addendum)
Admission Note:  D:38 yr male who presents IVC in no acute distress for the treatment of Psychosis and Depression. Pt appears flat and depressed. Pt was calm and cooperative with admission process. Pt denies SI/ HI/ AVH at this time. Pt stated he did not know why he was in the hospital. Pt minimizing his situation and forwarding little information at this time.   While pt was at ED: Boots taken off pt. 10.00 bill in one shoe and a small bag of white substance in the other shoe. Pt tried to reach for the baggy but nurse was able to get it. [employer] was given baggy with white substance, Shoes and $10.00 in locker.    Per Assessment: Patient not forthcoming with any information. During entire assessment patient was responding to stimuli. Patient was reaching into air as if he was grabbing food from the air and putting it up to his mouth and eating secretly. There was no food in his hands. Patient continued to look on both sides of himself and mumbling words. Patient denied SI, HI, psychosis and alcohol/drug usage. Patient IVC papers state he is bipolar and has stopped taking his meds, walks around seeing dragons and snakes, says he is deteriorating each day and they think he is likely to harm himself or a family member if he doesn't receive help. Patient denied prior psych inpatient treatment, suicidal attempts, self-harming behaviors and psychosis. Patient reported 6-8 hours sleep and normal appetite.   Patient reported living with sister, sisters boyfriend and niece. Patient was in jail 3-4 months ago due to drug charges. Patient does not have a job and has no access to guns.      A:Skin was assessed and found to be clear of any abnormal marks apart from bruise on L-elbow, scar R-ankle, deformed R-foot, R-leg shorter than Left , multiple tattoos . PT searched and no contraband found, POC and unit policies explained and understanding verbalized. Consents obtained. Food and fluids offered, and   accepted.  R: Pt had no additional questions or concerns.

## 2020-05-25 NOTE — [facility] Suicide Risk Assessment (Signed)
[facility] Admission Suicide Risk Assessment   Nursing information obtained from:  Patient Demographic factors:  Male, Caucasian Current Mental Status:  NA Loss Factors:    Historical Factors:  NA Risk Reduction Factors:  Employed, Positive social support  Total Time spent with patient: 30 minutes Principal Problem: <principal problem not specified> Diagnosis:  Active Problems:   Schizophrenia (HCC)  Subjective Data: Patient is seen and examined.  Patient is a 39 year old male who was brought into the [facility] emergency department under involuntary commitment.  The involuntary commitment paperwork stated that he has bipolar disorder and had been noncompliant with his medications.  He had been walking around seeing dragons and snakes.  He did stated Jeani Hawking that he might harm himself or a family member if he did not receive help.  He in the emergency department denied suicidal or homicidal ideation.  In the emergency department he admitted to snorting fentanyl recently.  He was emotionally labile in the emergency room.  He denied any auditory or visual hallucinations.  His sister reported that he had become particularly agitated with his niece.  Whenever the niece was in the room he stated that she was "pulling something out of him".  He denied any auditory or visual hallucinations today.  He denied any suicidal or homicidal ideation.  He stated he had been admitted to the hospital because "I needed some rest".  He stated that he had previously been treated with Xanax or Klonopin, Adderall and Zoloft.  Those were the only medications he could remember.  He stated he had just gotten out of prison within the last several months, and he had been discharged on Zoloft.  His drug screen was positive only for marijuana.  His blood alcohol was less than 10.  Staff reported that the patient has been responding to internal stimuli since he has been on the unit.  He had also been sent to the [facility] emergency  department on 04/07/2020.  Similar complaints at that time.  His list of medications included Flexeril.  The other psychiatric medications including Neurontin and sertraline were dated of 2018.  On his visit on 04/08/2020 his drug screen was positive for amphetamines and marijuana.  He also admitted at that time that he had been been using buprenorphine.  He was referred out for outpatient treatment.  It stated that the nurse practitioner was to restart medications after discharge.  That list of medications did not differ from previous.  Continued Clinical Symptoms:  Alcohol Use Disorder Identification Test Final Score (AUDIT): 12 The "Alcohol Use Disorders Identification Test", Guidelines for Use in Primary Care, Second Edition.  World [employer] writer [facility]). Score between 0-7:  no or low risk or alcohol related problems. Score between 8-15:  moderate risk of alcohol related problems. Score between 16-19:  high risk of alcohol related problems. Score 20 or above:  warrants further diagnostic evaluation for alcohol dependence and treatment.   CLINICAL FACTORS:   Alcohol/Substance Abuse/Dependencies Schizophrenia:   Less than 48 years old Paranoid or undifferentiated type Personality Disorders:   Cluster B   Musculoskeletal: Strength & Muscle Tone: within normal limits Gait & Station: Limping Patient leans: N/A  Psychiatric Specialty Exam: Physical Exam Vitals and nursing note reviewed.  HENT:     Head: Normocephalic and atraumatic.  Pulmonary:     Effort: Pulmonary effort is normal.  Neurological:     General: No focal deficit present.     Mental Status: He is alert and oriented to  person, place, and time.     Review of Systems  Musculoskeletal: Positive for gait problem, joint swelling and myalgias.    Blood pressure (!) 154/84, pulse 90, temperature 98.7 F (37.1 C), temperature source Oral, resp. rate 16, height 5' 9.5" (1.765 m), weight 87.1 kg.Body mass index is 27.95  kg/m.  General Appearance: Disheveled  Eye Contact:  Minimal  Speech:  Normal Rate  Volume:  Decreased  Mood:  Dysphoric  Affect:  Flat  Thought Process:  Disorganized and Descriptions of Associations: Loose  Orientation:  Negative  Thought Content:  Delusions, Hallucinations: Auditory and Paranoid Ideation  Suicidal Thoughts:  No  Homicidal Thoughts:  No  Memory:  Immediate;   Poor Recent;   Poor Remote;   Poor  Judgement:  Impaired  Insight:  Lacking  Psychomotor Activity:  Decreased  Concentration:  Concentration: Poor and Attention Span: Poor  Recall:  Poor  Fund of Knowledge:  Poor  Language:  Fair  Akathisia:  Negative  Handed:  Right  AIMS (if indicated):     Assets:  Desire for Improvement Resilience  ADL's:  Impaired  Cognition:  WNL  Sleep:  Number of Hours: 5      COGNITIVE FEATURES THAT CONTRIBUTE TO RISK:  Thought constriction (tunnel vision)    SUICIDE RISK:   Moderate:  Frequent suicidal ideation with limited intensity, and duration, some specificity in terms of plans, no associated intent, good self-control, limited dysphoria/symptomatology, some risk factors present, and identifiable protective factors, including available and accessible social support.  PLAN OF CARE: Patient is seen and examined.  Patient is a 39 year old male with an unspecified past psychiatric history including polysubstance use disorders.  The patient was admitted secondary to psychosis.  It is unclear at this point whether or not this is an organic illness, or secondary to his drug use.  His drug screen was only positive for marijuana most recently, but previously in August his drug screen was positive for amphetamines as well as marijuana.  He also admitted having used buprenorphine.  He will be admitted to the hospital.  He will be integrated in the milieu.  He will be encouraged to attend groups.  Just in case I will started on the opiate detox protocol.  He had previously been  treated with Zoloft, and we will restart that.  Given an unknown compliance issue we will only put him on 50 mg a day.  Given his psychotic symptoms we will start him on Risperdal.  Will start 2 mg p.o. daily and 3 mg p.o. nightly.  We will contact the sister for collateral information.  Review of his admission laboratories revealed an elevated alkaline phosphatase at 179.  This is actually slightly lower than it was a month ago.  1 month ago it was 331, 8 days ago it was 144, and now is 179.  1 month ago his AST was mildly elevated at 44 and his ALT at 124.  Today both his AST and ALT are normal.  CBC is normal.  Acetaminophen was less than 10, salicylate less than 7.  Urinalysis was essentially normal.  Blood alcohol was less than 10.  Drug screen was positive for marijuana.  No EKG in the chart.  That will be ordered.  Initially this morning his blood pressure was 134/61, but repeat showed that it had gone up to 154/84.  He is afebrile.  His pulse was 90.  I certify that inpatient services furnished can reasonably be expected to improve  the patient's condition.   Antonieta Pert, MD 05/25/2020, 9:19 AM

## 2020-05-25 NOTE — Progress Notes (Signed)
Pt stated he can not take Trazodone , it makes him hear trains

## 2020-05-25 NOTE — Progress Notes (Signed)
DAR NOTE: Patient presents with a blunted affect and depressed mood.  Denies suicidal thoughts, auditory and visual hallucinations.  Rates depression at 0, hopelessness at 0, and anxiety at 0.  Maintained on routine safety checks.  Medications given as prescribed.  Support and encouragement offered as needed.   Patient is withdrawn and isolates to his room for majority of this shift.  Patient is safe on the unit.  Offered no complaint.

## 2020-05-26 DIAGNOSIS — F200 Paranoid schizophrenia: Secondary | ICD-10-CM | POA: Diagnosis not present

## 2020-05-26 MED ORDER — RISPERIDONE 3 MG PO TBDP
3.0000 mg | ORAL_TABLET | Freq: Every day | ORAL | Status: DC
  Administered 2020-05-26: 3 mg via ORAL
  Filled 2020-05-26 (×3): qty 1

## 2020-05-26 MED ORDER — RISPERIDONE 2 MG PO TBDP
4.0000 mg | ORAL_TABLET | Freq: Every day | ORAL | Status: DC
  Filled 2020-05-26: qty 2

## 2020-05-26 NOTE — Progress Notes (Signed)
Pt stated he was having hard time breathing. Pt was assessed and appeared to be in no distress, lungs clear, pt appeared to be having increased anxiety. Pt stated he did not like taking Trazodone, but pt requested it tonight to help him sleep. 30 min after taking the Trazodone pt stated he was starting to feel funny . Pt was given PRN Ativan per [facility], will continue to monitor.     05/26/20 2226  Vital Signs  Pulse Rate (!) 107  Pulse Rate Source Monitor  BP 120/73  BP Method Automatic  Oxygen Therapy  SpO2 98 %

## 2020-05-26 NOTE — BHH Counselor (Signed)
CSW attempted to complete PSA, however this patient was sleeping and requested CSW to come at a later time.    CSW will attempt to complete PSA later this date.    Ruthann Cancer MSW, LCSW Clincal Social Worker  [facility]
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
## 2020-05-26 NOTE — BHH Group Notes (Signed)
The focus of this group is to help patients establish daily goals to achieve during treatment and discuss how the patient can incorporate goal setting into their daily lives to aide in recovery.  Pt did not attend group 

## 2020-05-26 NOTE — Plan of Care (Signed)
Progress note  D: pt found in bed; compliant with medication administration. Pt presents, sullen, sad, pained, and pensive. Pt is unsteady in gate. Pt states they didn't sleep well. Pt has complaints of pain in their R foot they rate a 10/10. Pt also has complaints of muscle spasms. Pt is pleasant during assessment but minimal. Pt continues to isolate to their room throughout the day. Pt denies si/hi/ah/vh and verbally agrees to approach staff if these become apparent or before harming themself/others while at bhh.  A: Pt provided support and encouragement. Pt given medication per protocol and standing orders. Q84m safety checks implemented and continued.  R: Pt safe on the unit. Will continue to monitor.  Pt progressing in the following metrics  Problem: Education: Goal: Knowledge of [facility] Education information/materials will improve Outcome: Progressing Goal: Emotional status will improve Outcome: Progressing Goal: Mental status will improve Outcome: Progressing Goal: Verbalization of understanding the information provided will improve Outcome: Progressing
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
## 2020-05-27 DIAGNOSIS — F1220 Cannabis dependence, uncomplicated: Secondary | ICD-10-CM

## 2020-05-27 DIAGNOSIS — F1910 Other psychoactive substance abuse, uncomplicated: Secondary | ICD-10-CM | POA: Clinically undetermined

## 2020-05-27 DIAGNOSIS — F1010 Alcohol abuse, uncomplicated: Secondary | ICD-10-CM | POA: Diagnosis not present

## 2020-05-27 DIAGNOSIS — F29 Unspecified psychosis not due to a substance or known physiological condition: Secondary | ICD-10-CM | POA: Diagnosis present

## 2020-05-27 DIAGNOSIS — F1190 Opioid use, unspecified, uncomplicated: Secondary | ICD-10-CM

## 2020-05-27 DIAGNOSIS — F17200 Nicotine dependence, unspecified, uncomplicated: Secondary | ICD-10-CM | POA: Diagnosis present

## 2020-05-27 DIAGNOSIS — F1510 Other stimulant abuse, uncomplicated: Secondary | ICD-10-CM | POA: Diagnosis not present

## 2020-05-27 DIAGNOSIS — F17211 Nicotine dependence, cigarettes, in remission: Secondary | ICD-10-CM

## 2020-05-27 DIAGNOSIS — F19959 Other psychoactive substance use, unspecified with psychoactive substance-induced psychotic disorder, unspecified: Secondary | ICD-10-CM | POA: Diagnosis present

## 2020-05-27 MED ORDER — TRAZODONE HCL 50 MG PO TABS
50.0000 mg | ORAL_TABLET | Freq: Every evening | ORAL | 0 refills | Status: DC | PRN
Start: 2020-05-27 — End: 2021-09-07

## 2020-05-27 MED ORDER — OMEPRAZOLE 40 MG PO CPDR: 40.0000 mg | DELAYED_RELEASE_CAPSULE | Freq: Every day | ORAL | 0 refills | Status: DC

## 2020-05-27 MED ORDER — SERTRALINE HCL 100 MG PO TABS: 100.0000 mg | ORAL_TABLET | Freq: Every day | ORAL | 0 refills | Status: DC

## 2020-05-27 MED ORDER — HYDROXYZINE HCL 25 MG PO TABS
25.0000 mg | ORAL_TABLET | Freq: Three times a day (TID) | ORAL | 0 refills | Status: DC | PRN
Start: 2020-05-27 — End: 2021-09-07

## 2020-05-27 MED ORDER — METHOCARBAMOL 500 MG PO TABS
500.0000 mg | ORAL_TABLET | Freq: Three times a day (TID) | ORAL | 0 refills | Status: DC | PRN
Start: 2020-05-27 — End: 2021-09-07

## 2020-05-27 MED ORDER — RISPERIDONE 3 MG PO TBDP
3.0000 mg | ORAL_TABLET | Freq: Every day | ORAL | 0 refills | Status: DC
Start: 2020-05-27 — End: 2020-05-28

## 2020-05-27 NOTE — BHH Group Notes (Signed)
The focus of this group is to help patients establish daily goals to achieve during treatment and discuss how the patient can incorporate goal setting into their daily lives to aide in recovery.  Pt did not attend group 
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
## 2020-05-27 NOTE — Progress Notes (Signed)
Pt discharged to lobby. Pt was stable and appreciative at that time. All papers, samples and prescriptions were given and valuables returned. Verbal understanding expressed. Denies SI/HI and A/VH. Pt given opportunity to express concerns and ask questions.  

## 2020-05-27 NOTE — [facility] Suicide Risk Assessment (Signed)
[facility] Discharge Suicide Risk Assessment   Principal Problem: Schizophrenia Bradley Mcgrath) Discharge Diagnoses: Principal Problem:   Schizophrenia (HCC) Active Problems:   Marijuana dependence (HCC)   Substance-induced psychotic disorder (HCC)   Amphetamine abuse (HCC)   Polysubstance abuse (HCC)   Opiate use   Nicotine dependence   Total Time spent with patient: 45 minutes  Musculoskeletal: Strength & Muscle Tone: within normal limits Gait & Station: normal Patient leans: N/A  Psychiatric Specialty Exam: Review of Systems  Constitutional: Negative.   HENT: Negative.   Respiratory: Negative.   Cardiovascular: Negative.   Gastrointestinal: Negative.   Musculoskeletal: Negative.   Neurological: Negative.   Psychiatric/Behavioral: Negative for agitation, behavioral problems, confusion, decreased concentration, dysphoric mood, hallucinations, self-injury, sleep disturbance and suicidal ideas. The patient is not nervous/anxious and is not hyperactive.     Blood pressure 120/73, pulse (!) 107, temperature 97.7 F (36.5 C), temperature source Oral, resp. rate 16, height 5' 9.5" (1.765 m), weight 87.1 kg, SpO2 98 %.Body mass index is 27.95 kg/m.  General Appearance: Casual  Eye Contact::  Good  Speech:  Clear and Coherent and Normal Rate409  Volume:  Normal  Mood:  Euthymic and mild anxiety  Affect:  Congruent  Thought Process:  Coherent and Descriptions of Associations: Intact  Orientation:  Full (Time, Place, and Person)  Thought Content:  Logical and Hallucinations: None  Suicidal Thoughts:  No  Homicidal Thoughts:  No  Memory:  Immediate;   Good Recent;   Poor Remote;   Fair  Judgement:  Fair  Insight:  Fair  Psychomotor Activity:  Normal  Concentration:  Fair  Recall:  [employer] of Knowledge:Fair  Language: Good  Akathisia:  No  Handed:  Right  AIMS (if indicated):     Assets:  Communication Skills Desire for Improvement Housing Leisure Time Resilience Social  Support Vocational/Educational  Sleep:  Number of Hours: 4.5  Cognition: WNL  ADL's:  Intact   Mental Status Per Nursing Assessment::   On Admission:  AVH, depression, and suicidal ideation.  A brief family meeting was held with patient's sister, Bradley Mcgrath, today. Discussed that marijuana use could be contributing to psychotic features. Sister does not express any concerns for patient's safety to self or other.  She confirms that patient has a Engineer, drilling who is willing to ensure that Bradley Mcgrath makes his outpatient mental health treatment.  Reinforced importance of seeking substance use rehabilitation through [facility], in addition to mental health services.  Demographic Factors:  Male and Low socioeconomic status  Loss Factors: NA  Historical Factors: Prior suicide attempts, Impulsivity and traumatic exposure  Risk Reduction Factors:   Living with another person, especially a relative, Positive social support, Positive therapeutic relationship and will be starting a job  Continued Clinical Symptoms:  Anxiety Depression Psychosis Substance use More than one psychiatric diagnosis  Cognitive Features That Contribute To Risk:  None    Suicide Risk:  Minimal: No identifiable suicidal ideation.  Patients presenting with no risk factors but with morbid ruminations; may be classified as minimal risk based on the severity of the depressive symptoms   Follow-up Information    [facility], [facility]. Go on 05/30/2020.   Why: You have a [facility] follow up appointment for therapy and medication management services on 05/30/20 at 8:30 am.  This appointment will be held in person.  Contact information: [address] [place] [address] [phone]               Plan Of Care/Follow-up  recommendations:  Activity:  ad lib Diet:  as tolerated Tests:  reviewed during admission Other:  Ensure follow-up wtih outpatient mental health as above.    On day of discharge  following sustained improvement in the affect of this patient, continued report of euthymic mood, repeated denial of suicidal, homicidal, and other violent ideation, adequate interaction with peers, active participation in groups while on the unit, and denial of adverse reactions from medications, the treatment team decided Bradley Mcgrath was stable for discharge home with scheduled mental health treatment.  He was able to engage in safety planning including plan to return to [facility] or contact emergency services if he feels unable to maintain his own safety or the safety of others. Patient had no further questions, comments, or concerns. Discharge into care of family friend, who agrees to maintain patient safety.  Patient aware to return to nearest crisis center, ED or to call 911 for worsening symptoms of depression, suicidal or homicidal thoughts or AVH.     Per sister with whom patient lives, patient can be picked up by family friend- Bradley Mcgrath.   Bradley Craft, MD 05/27/2020, 2:32 PM

## 2020-05-27 NOTE — Progress Notes (Signed)
  [facility] Adult Case Management Discharge Plan :  Will you be returning to the same living situation after discharge:  Yes,  to sisters residence At discharge, do you have transportation home?: Yes,  friend is to pick this patient up Do you have the ability to pay for your medications: No. Samples to be provided at discharge  Release of information consent forms completed and in the chart;  Patient's signature needed at discharge.  Patient to Follow up at:  Follow-up Information    [facility]. Go on 05/30/2020.   Why: You have a hospital follow up appointment for therapy and medication management services on 05/30/20 at 8:30 am.  This appointment will be held in person.  Contact information: [address] [place] [address] [phone]               Next level of care provider has access to [facility] Link:no  Safety Planning and Suicide Prevention discussed: Yes,  with sister  Have you used any form of tobacco in the last 30 days? (Cigarettes, Smokeless Tobacco, Cigars, and/or Pipes): Yes  Has patient been referred to the Quitline?: Patient refused referral  Patient has been referred for addiction treatment: Pt. refused referral  Otelia Santee, LCSW 05/27/2020, 1:58 PM

## 2020-05-28 ENCOUNTER — Other Ambulatory Visit: Payer: Self-pay | Admitting: Psychiatry

## 2020-05-28 MED ORDER — RISPERIDONE 3 MG PO TABS: 3.0000 mg | ORAL_TABLET | Freq: Every day | ORAL | 0 refills | Status: DC

## 2020-05-28 NOTE — Progress Notes (Signed)
Risperdal -M tablet was $360, patient is requesting to change to tablet.    Current Outpatient Medications:    hydrOXYzine (ATARAX/VISTARIL) 25 MG tablet, Take 1 tablet (25 mg total) by mouth every 8 (eight) hours as needed for anxiety., Disp: 30 tablet, Rfl: 0   methocarbamol (ROBAXIN) 500 MG tablet, Take 1 tablet (500 mg total) by mouth every 8 (eight) hours as needed for muscle spasms., Disp: 10 tablet, Rfl: 0   omeprazole (PRILOSEC) 40 MG capsule, Take 1 capsule (40 mg total) by mouth daily., Disp: 30 capsule, Rfl: 0   risperiDONE (RISPERDAL) 3 MG tablet, Take 1 tablet (3 mg total) by mouth at bedtime., Disp: 30 tablet, Rfl: 0   sertraline (ZOLOFT) 100 MG tablet, Take 1 tablet (100 mg total) by mouth daily., Disp: 30 tablet, Rfl: 0   traMADol (ULTRAM) 50 MG tablet, Take 1 tablet (50 mg total) by mouth every 6 (six) hours as needed., Disp: 10 tablet, Rfl: 0   traZODone (DESYREL) 50 MG tablet, Take 1 tablet (50 mg total) by mouth at bedtime as needed for sleep., Disp: 30 tablet, Rfl: 0

## 2020-08-19 ENCOUNTER — Encounter (HOSPITAL_COMMUNITY): Payer: Self-pay

## 2020-08-19 ENCOUNTER — Emergency Department (HOSPITAL_COMMUNITY)
Admission: EM | Admit: 2020-08-19 | Discharge: 2020-08-19 | Disposition: A | Discharge status: Home / Self Care | Payer: Self-pay | Attending: Emergency Medicine | Admitting: Emergency Medicine

## 2020-08-19 ENCOUNTER — Other Ambulatory Visit: Payer: Self-pay

## 2020-08-19 DIAGNOSIS — K921 Melena: Secondary | ICD-10-CM | POA: Insufficient documentation

## 2020-08-19 DIAGNOSIS — I10 Essential (primary) hypertension: Secondary | ICD-10-CM | POA: Insufficient documentation

## 2020-08-19 DIAGNOSIS — F17210 Nicotine dependence, cigarettes, uncomplicated: Secondary | ICD-10-CM | POA: Insufficient documentation

## 2020-08-19 LAB — COMPREHENSIVE METABOLIC PANEL
ALT: 12 U/L (ref 0–44)
AST: 10 U/L — ABNORMAL LOW (ref 15–41)
Albumin: 3.7 g/dL (ref 3.5–5.0)
Alkaline Phosphatase: 90 U/L (ref 38–126)
Anion gap: 7 (ref 5–15)
BUN: 18 mg/dL (ref 6–20)
CO2: 27 mmol/L (ref 22–32)
Calcium: 8.6 mg/dL — ABNORMAL LOW (ref 8.9–10.3)
Chloride: 105 mmol/L (ref 98–111)
Creatinine, Ser: 0.9 mg/dL (ref 0.61–1.24)
GFR, Estimated: >60 mL/min (ref >60)
Glucose, Bld: 87 mg/dL (ref 70–99)
Potassium: 4.3 mmol/L (ref 3.5–5.1)
Sodium: 139 mmol/L (ref 135–145)
Total Bilirubin: 0.4 mg/dL (ref 0.3–1.2)
Total Protein: 6.6 g/dL (ref 6.5–8.1)

## 2020-08-19 LAB — CBC WITH DIFFERENTIAL/PLATELET
Abs Immature Granulocytes: 0.05 10*3/uL (ref 0.00–0.07)
Basophils Absolute: 0 10*3/uL (ref 0.0–0.1)
Basophils Relative: 1 %
Eosinophils Absolute: 0.2 10*3/uL (ref 0.0–0.5)
Eosinophils Relative: 2 %
HCT: 42.2 % (ref 39.0–52.0)
Hemoglobin: 14 g/dL (ref 13.0–17.0)
Immature Granulocytes: 1 %
Lymphocytes Relative: 23 %
Lymphs Abs: 1.6 10*3/uL (ref 0.7–4.0)
MCH: 30 pg (ref 26.0–34.0)
MCHC: 33.2 g/dL (ref 30.0–36.0)
MCV: 90.4 fL (ref 80.0–100.0)
Monocytes Absolute: 0.5 10*3/uL (ref 0.1–1.0)
Monocytes Relative: 6 %
Neutro Abs: 4.7 10*3/uL (ref 1.7–7.7)
Neutrophils Relative %: 67 %
Platelets: 213 10*3/uL (ref 150–400)
RBC: 4.67 MIL/uL (ref 4.22–5.81)
RDW: 13 % (ref 11.5–15.5)
WBC: 7.1 10*3/uL (ref 4.0–10.5)
nRBC: 0 % (ref 0.0–0.2)

## 2020-08-19 LAB — POC OCCULT BLOOD, ED: Fecal Occult Bld: POSITIVE — AB

## 2020-08-19 LAB — LIPASE, BLOOD: Lipase: 32 U/L (ref 11–51)

## 2020-08-19 MED ORDER — HYDROCORTISONE ACETATE 25 MG RE SUPP: 25.0000 mg | Freq: Two times a day (BID) | RECTAL | 0 refills | Status: AC

## 2020-08-19 MED ORDER — METAMUCIL SMOOTH TEXTURE 58.6 % PO POWD: 1.0000 | Freq: Three times a day (TID) | ORAL | 12 refills | Status: DC

## 2020-08-19 MED ORDER — DICYCLOMINE HCL 20 MG PO TABS: 20.0000 mg | ORAL_TABLET | Freq: Three times a day (TID) | ORAL | 0 refills | Status: DC | PRN

## 2020-08-19 NOTE — Discharge Instructions (Signed)
You were seen in the emergency department today with gastrointestinal bleeding.  I am starting you on a suppository to help with symptoms along with a fiber supplement to help with your diarrhea.  Please take the Bentyl as needed for abdominal pain and cramps.  Please call the free clinic number provided to establish care.  They can assist you with basic primary care needs and assist with getting insurance.  Ultimately, you will need to see the gastroenterologist for likely colonoscopy.   If you develop fever, with sudden worsening pain, severe bleeding please return to the emergency department.
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
## 2020-08-19 NOTE — ED Triage Notes (Signed)
Pt presents to ED with blood in his stool.  Blood noted x 2 in last 24 hours. Bright red, noted in toilet bowl. Abdominal pain noted. Hx of hemoroids.

## 2020-09-20 ENCOUNTER — Encounter (HOSPITAL_COMMUNITY): Payer: Self-pay | Admitting: *Deleted

## 2020-09-20 ENCOUNTER — Emergency Department (HOSPITAL_COMMUNITY)
Admission: EM | Admit: 2020-09-20 | Discharge: 2020-09-20 | Disposition: A | Discharge status: Home / Self Care | Payer: Self-pay | Attending: Emergency Medicine | Admitting: Emergency Medicine

## 2020-09-20 ENCOUNTER — Emergency Department (HOSPITAL_COMMUNITY): Payer: Self-pay

## 2020-09-20 ENCOUNTER — Other Ambulatory Visit: Payer: Self-pay

## 2020-09-20 DIAGNOSIS — R030 Elevated blood-pressure reading, without diagnosis of hypertension: Secondary | ICD-10-CM

## 2020-09-20 DIAGNOSIS — R519 Headache, unspecified: Secondary | ICD-10-CM | POA: Insufficient documentation

## 2020-09-20 DIAGNOSIS — F17210 Nicotine dependence, cigarettes, uncomplicated: Secondary | ICD-10-CM | POA: Insufficient documentation

## 2020-09-20 DIAGNOSIS — Z79899 Other long term (current) drug therapy: Secondary | ICD-10-CM | POA: Insufficient documentation

## 2020-09-20 DIAGNOSIS — I10 Essential (primary) hypertension: Secondary | ICD-10-CM | POA: Insufficient documentation

## 2020-09-20 LAB — URINALYSIS, ROUTINE W REFLEX MICROSCOPIC
Bilirubin Urine: NEGATIVE
Glucose, UA: NEGATIVE mg/dL
Hgb urine dipstick: NEGATIVE
Ketones, ur: NEGATIVE mg/dL
Leukocytes,Ua: NEGATIVE
Nitrite: NEGATIVE
Protein, ur: NEGATIVE mg/dL
Specific Gravity, Urine: 1.023 (ref 1.005–1.030)
pH: 6 (ref 5.0–8.0)

## 2020-09-20 LAB — CBC WITH DIFFERENTIAL/PLATELET
Abs Immature Granulocytes: 0.03 10*3/uL (ref 0.00–0.07)
Basophils Absolute: 0 10*3/uL (ref 0.0–0.1)
Basophils Relative: 0 %
Eosinophils Absolute: 0.1 10*3/uL (ref 0.0–0.5)
Eosinophils Relative: 1 %
HCT: 42.9 % (ref 39.0–52.0)
Hemoglobin: 14.4 g/dL (ref 13.0–17.0)
Immature Granulocytes: 0 %
Lymphocytes Relative: 21 %
Lymphs Abs: 1.5 10*3/uL (ref 0.7–4.0)
MCH: 30.3 pg (ref 26.0–34.0)
MCHC: 33.6 g/dL (ref 30.0–36.0)
MCV: 90.3 fL (ref 80.0–100.0)
Monocytes Absolute: 0.4 10*3/uL (ref 0.1–1.0)
Monocytes Relative: 5 %
Neutro Abs: 5.1 10*3/uL (ref 1.7–7.7)
Neutrophils Relative %: 73 %
Platelets: 253 10*3/uL (ref 150–400)
RBC: 4.75 MIL/uL (ref 4.22–5.81)
RDW: 13.1 % (ref 11.5–15.5)
WBC: 7.1 10*3/uL (ref 4.0–10.5)
nRBC: 0 % (ref 0.0–0.2)

## 2020-09-20 LAB — BASIC METABOLIC PANEL
Anion gap: 8 (ref 5–15)
BUN: 14 mg/dL (ref 6–20)
CO2: 25 mmol/L (ref 22–32)
Calcium: 8.7 mg/dL — ABNORMAL LOW (ref 8.9–10.3)
Chloride: 104 mmol/L (ref 98–111)
Creatinine, Ser: 0.79 mg/dL (ref 0.61–1.24)
GFR, Estimated: >60 mL/min (ref >60)
Glucose, Bld: 101 mg/dL — ABNORMAL HIGH (ref 70–99)
Potassium: 3.9 mmol/L (ref 3.5–5.1)
Sodium: 137 mmol/L (ref 135–145)

## 2020-09-20 MED ORDER — LISINOPRIL 10 MG PO TABS: 10.0000 mg | ORAL_TABLET | Freq: Every day | ORAL | 0 refills | Status: DC
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
## 2020-09-20 NOTE — ED Notes (Signed)
Pt reports seeing white spots for couple weeks.  PA aware.

## 2020-09-20 NOTE — Discharge Instructions (Addendum)
As discussed, all of your labs are reassuring today.  Your CT head did not show any bleeding.  I am sending you home with a blood pressure medication.  Take once daily.  I have included the number of a PCP in town.  Please call today to schedule appointment to establish care.  Continue to monitor your blood pressure.  Return to the ER for new or worsening symptoms.

## 2020-09-20 NOTE — ED Triage Notes (Signed)
States he was advised by [facility] to come in for BP evaluation

## 2020-11-01 ENCOUNTER — Emergency Department (HOSPITAL_COMMUNITY): Payer: Self-pay

## 2020-11-01 ENCOUNTER — Encounter (HOSPITAL_COMMUNITY): Payer: Self-pay

## 2020-11-01 ENCOUNTER — Emergency Department (HOSPITAL_COMMUNITY)
Admission: EM | Admit: 2020-11-01 | Discharge: 2020-11-01 | Disposition: A | Discharge status: Home / Self Care | Payer: Self-pay | Attending: Emergency Medicine | Admitting: Emergency Medicine

## 2020-11-01 ENCOUNTER — Other Ambulatory Visit: Payer: Self-pay

## 2020-11-01 DIAGNOSIS — T402X1A Poisoning by other opioids, accidental (unintentional), initial encounter: Secondary | ICD-10-CM | POA: Insufficient documentation

## 2020-11-01 DIAGNOSIS — I10 Essential (primary) hypertension: Secondary | ICD-10-CM | POA: Insufficient documentation

## 2020-11-01 DIAGNOSIS — R519 Headache, unspecified: Secondary | ICD-10-CM | POA: Insufficient documentation

## 2020-11-01 DIAGNOSIS — R110 Nausea: Secondary | ICD-10-CM | POA: Insufficient documentation

## 2020-11-01 DIAGNOSIS — R739 Hyperglycemia, unspecified: Secondary | ICD-10-CM | POA: Insufficient documentation

## 2020-11-01 DIAGNOSIS — R55 Syncope and collapse: Secondary | ICD-10-CM | POA: Insufficient documentation

## 2020-11-01 DIAGNOSIS — Z79899 Other long term (current) drug therapy: Secondary | ICD-10-CM | POA: Insufficient documentation

## 2020-11-01 DIAGNOSIS — T40601A Poisoning by unspecified narcotics, accidental (unintentional), initial encounter: Secondary | ICD-10-CM

## 2020-11-01 DIAGNOSIS — F17210 Nicotine dependence, cigarettes, uncomplicated: Secondary | ICD-10-CM | POA: Insufficient documentation

## 2020-11-01 LAB — COMPREHENSIVE METABOLIC PANEL
ALT: 136 U/L — ABNORMAL HIGH (ref 0–44)
AST: 93 U/L — ABNORMAL HIGH (ref 15–41)
Albumin: 3.6 g/dL (ref 3.5–5.0)
Alkaline Phosphatase: 175 U/L — ABNORMAL HIGH (ref 38–126)
Anion gap: 12 (ref 5–15)
BUN: 21 mg/dL — ABNORMAL HIGH (ref 6–20)
CO2: 26 mmol/L (ref 22–32)
Calcium: 8.5 mg/dL — ABNORMAL LOW (ref 8.9–10.3)
Chloride: 98 mmol/L (ref 98–111)
Creatinine, Ser: 1.05 mg/dL (ref 0.61–1.24)
GFR, Estimated: >60 mL/min (ref >60)
Glucose, Bld: 267 mg/dL — ABNORMAL HIGH (ref 70–99)
Potassium: 3.4 mmol/L — ABNORMAL LOW (ref 3.5–5.1)
Sodium: 136 mmol/L (ref 135–145)
Total Bilirubin: 0.4 mg/dL (ref 0.3–1.2)
Total Protein: 6.6 g/dL (ref 6.5–8.1)

## 2020-11-01 LAB — CBC WITH DIFFERENTIAL/PLATELET
Abs Immature Granulocytes: 0.12 10*3/uL — ABNORMAL HIGH (ref 0.00–0.07)
Basophils Absolute: 0 10*3/uL (ref 0.0–0.1)
Basophils Relative: 0 %
Eosinophils Absolute: 0.2 10*3/uL (ref 0.0–0.5)
Eosinophils Relative: 2 %
HCT: 42 % (ref 39.0–52.0)
Hemoglobin: 14.4 g/dL (ref 13.0–17.0)
Immature Granulocytes: 1 %
Lymphocytes Relative: 14 %
Lymphs Abs: 1.5 10*3/uL (ref 0.7–4.0)
MCH: 31.2 pg (ref 26.0–34.0)
MCHC: 34.3 g/dL (ref 30.0–36.0)
MCV: 90.9 fL (ref 80.0–100.0)
Monocytes Absolute: 0.5 10*3/uL (ref 0.1–1.0)
Monocytes Relative: 5 %
Neutro Abs: 8.3 10*3/uL — ABNORMAL HIGH (ref 1.7–7.7)
Neutrophils Relative %: 78 %
Platelets: 201 10*3/uL (ref 150–400)
RBC: 4.62 MIL/uL (ref 4.22–5.81)
RDW: 12.9 % (ref 11.5–15.5)
WBC: 10.6 10*3/uL — ABNORMAL HIGH (ref 4.0–10.5)
nRBC: 0 % (ref 0.0–0.2)

## 2020-11-01 LAB — ETHANOL: Alcohol, Ethyl (B): <10 mg/dL (ref <10)

## 2020-11-01 LAB — CBG MONITORING, ED: Glucose-Capillary: 267 mg/dL — ABNORMAL HIGH (ref 70–99)

## 2020-11-01 MED ORDER — SODIUM CHLORIDE 0.9 % IV BOLUS
1000.0000 mL | Freq: Once | INTRAVENOUS | Status: AC
  Administered 2020-11-01: 1000 mL via INTRAVENOUS

## 2020-11-01 MED ORDER — NALOXONE HCL 4 MG/0.1ML NA LIQD
1.0000 | Freq: Once | NASAL | Status: AC
  Administered 2020-11-01: 1 via NASAL
  Filled 2020-11-01: qty 4

## 2020-11-01 MED ORDER — ONDANSETRON HCL 4 MG/2ML IJ SOLN
4.0000 mg | Freq: Once | INTRAMUSCULAR | Status: AC
  Administered 2020-11-01: 4 mg via INTRAVENOUS
  Filled 2020-11-01: qty 2

## 2020-11-01 MED ORDER — ACETAMINOPHEN 500 MG PO TABS
1000.0000 mg | ORAL_TABLET | Freq: Once | ORAL | Status: AC
  Administered 2020-11-01: 1000 mg via ORAL
  Filled 2020-11-01: qty 2
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
## 2020-11-01 NOTE — Discharge Instructions (Signed)
If you develop continued, recurrent, or worsening headache, fever, neck stiffness, vomiting, blurry or double vision, weakness or numbness in your arms or legs, trouble speaking, or any other new/concerning symptoms then return to the ER for evaluation.  
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
## 2020-12-20 IMAGING — DX DG ELBOW COMPLETE 3+V*L*
4 series · 4 of 4 positions shown · non-contrast
Comparison: None.

CLINICAL DATA: Elbow swelling and redness

EXAM:
LEFT ELBOW - COMPLETE 3+ VIEW

[elbow ap]
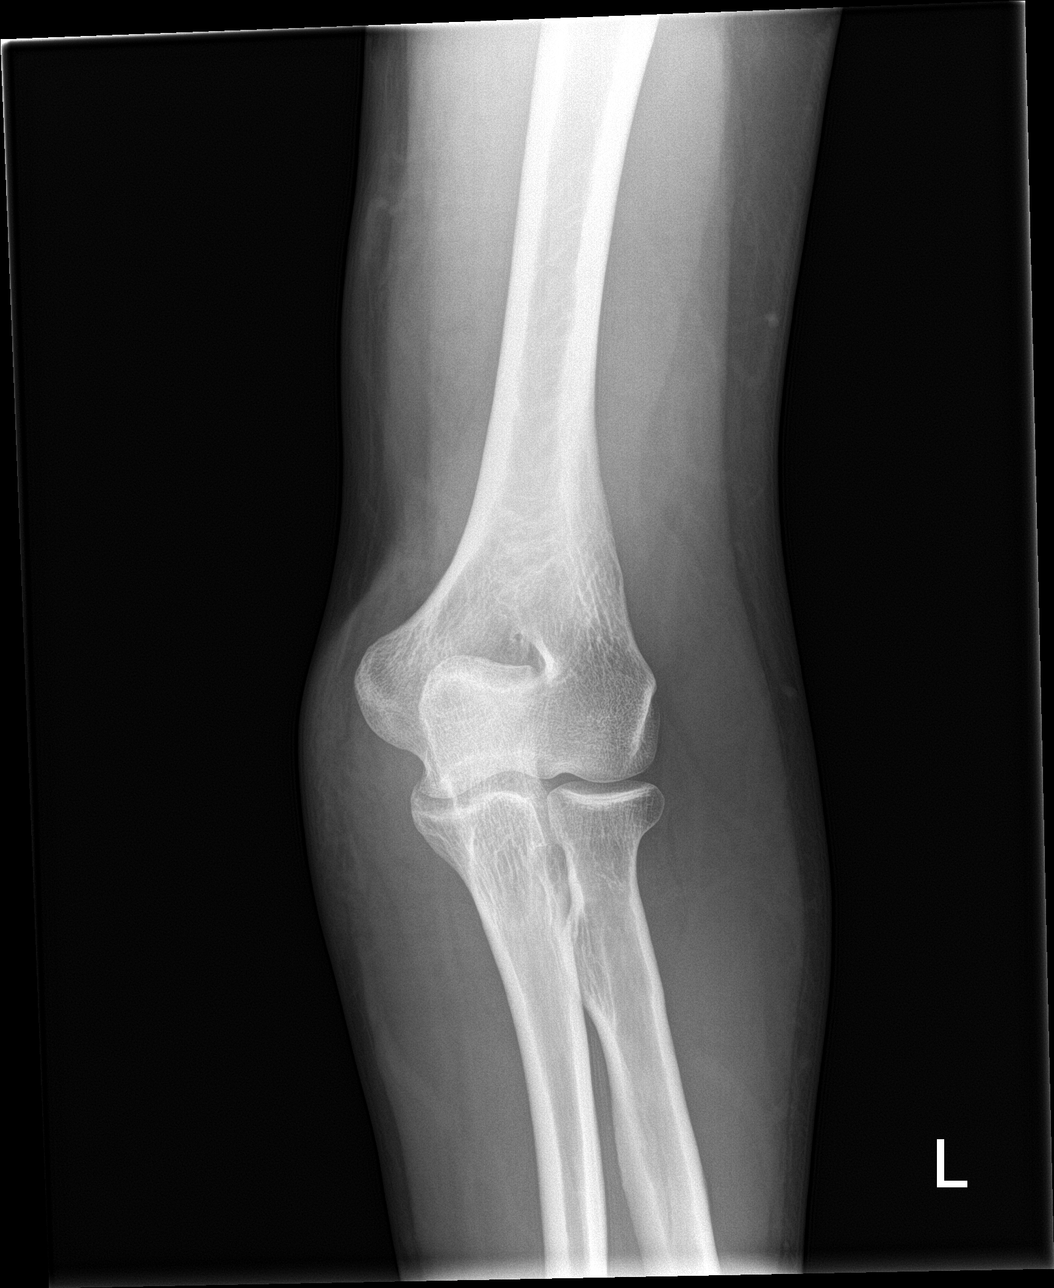

[elbow obl (1 of 2)]
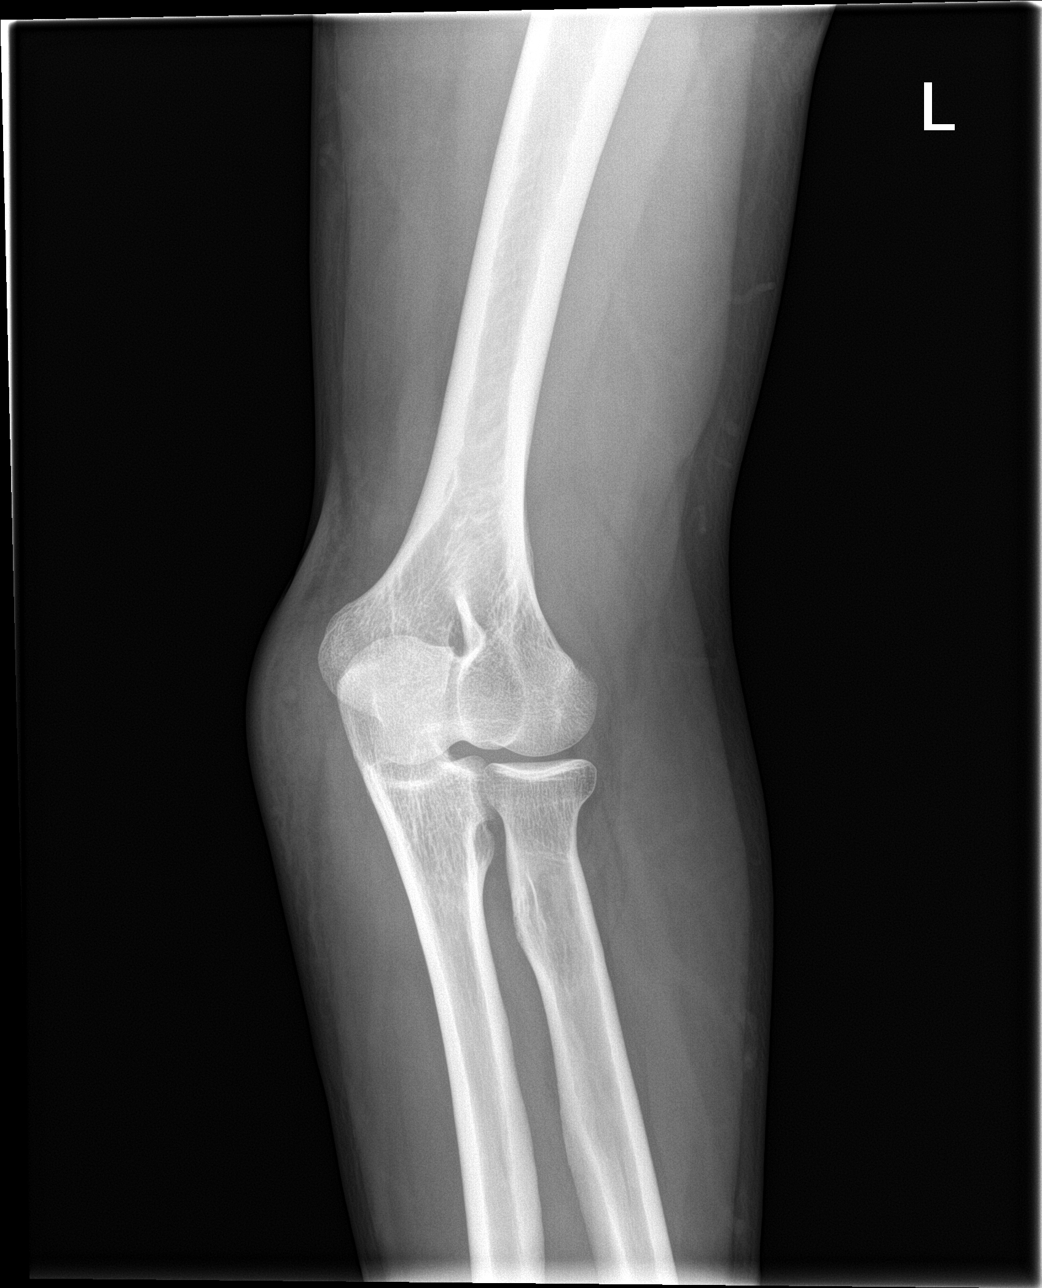

[elbow obl (2 of 2)]
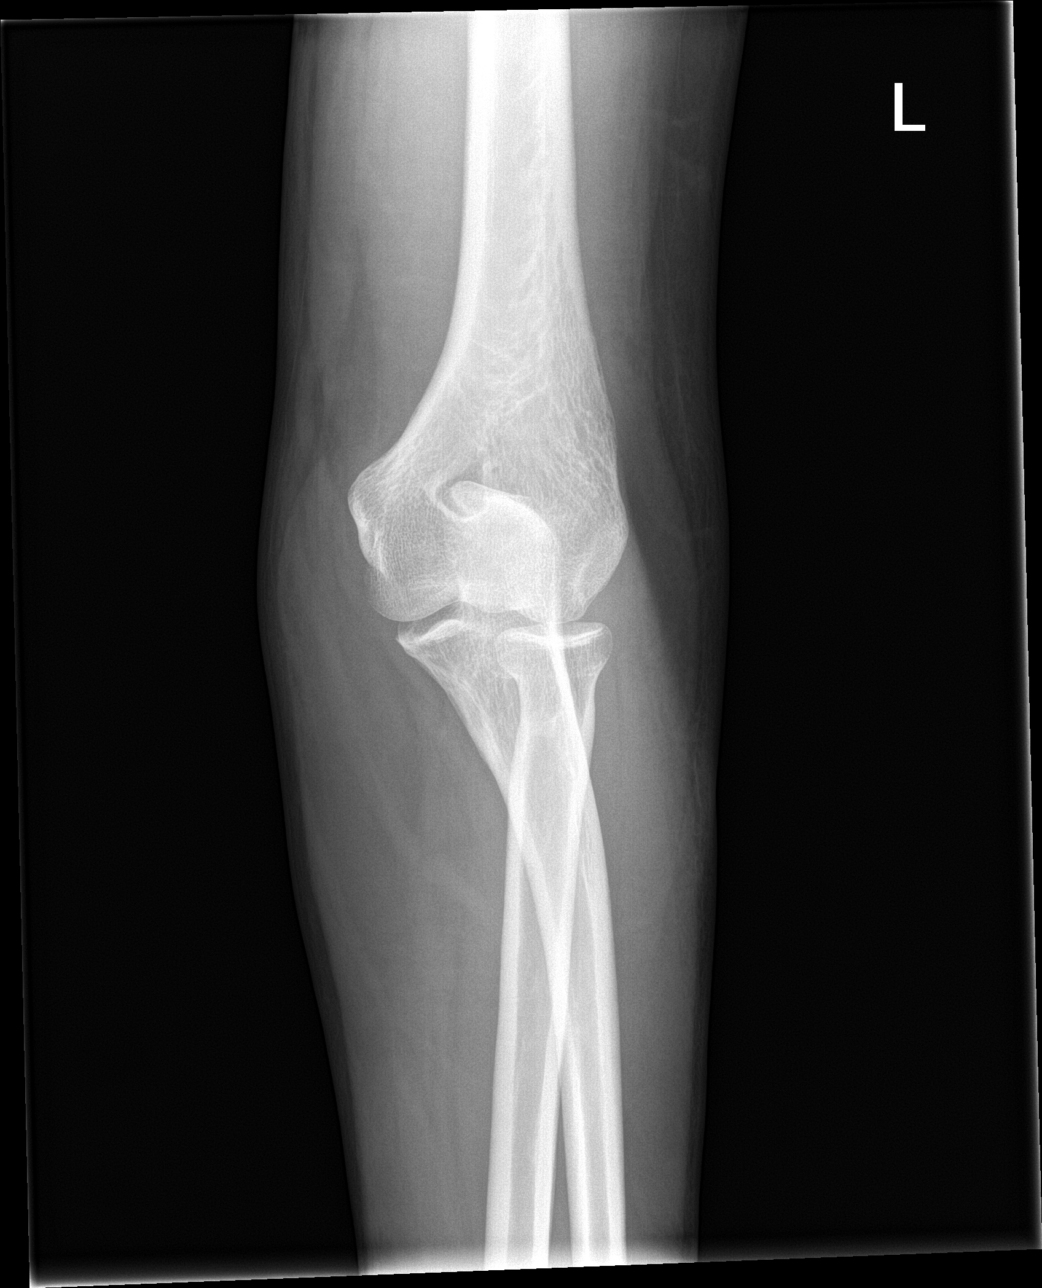

[elbow lat]
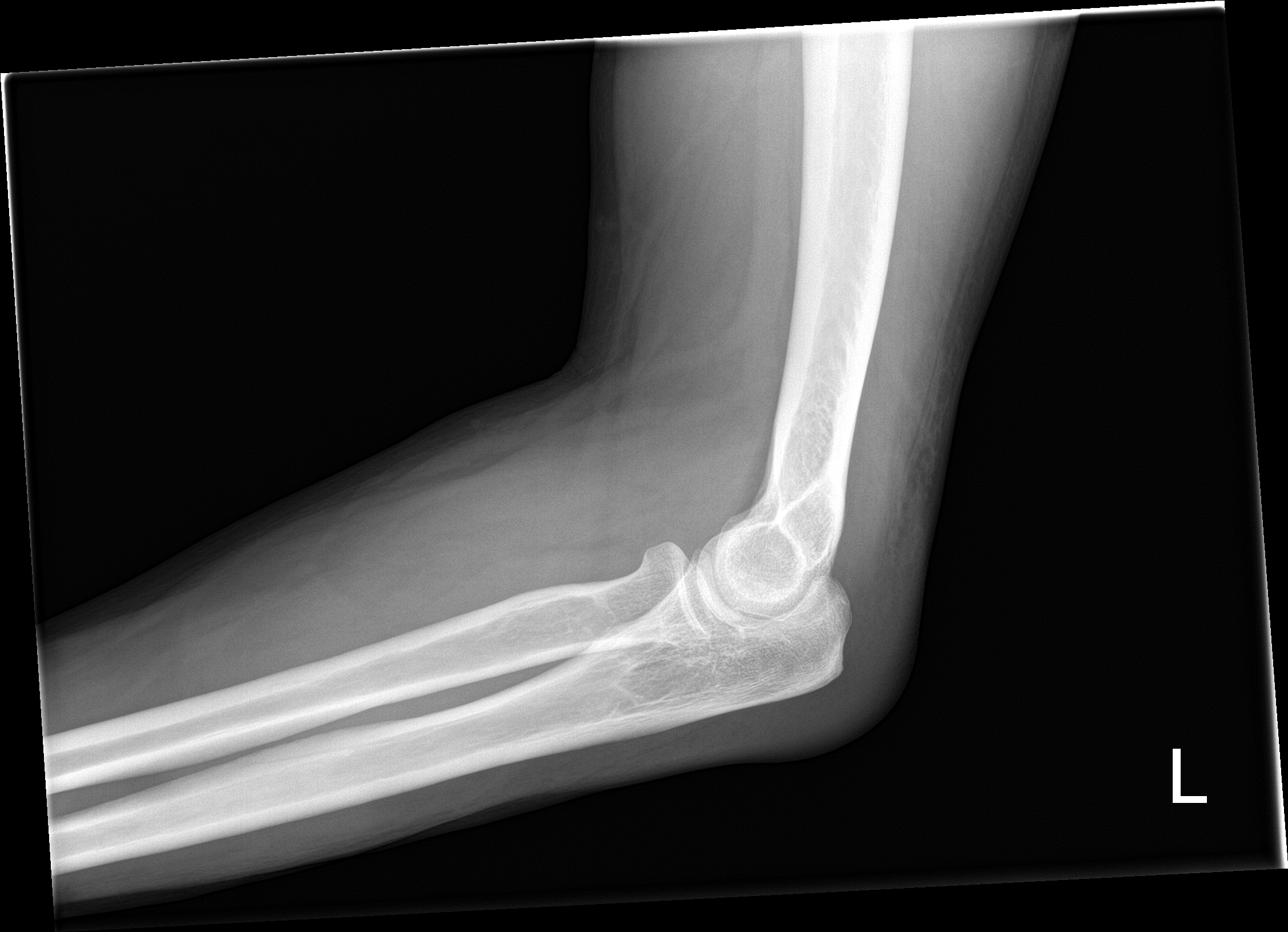

[4 of 4 positions shown; findings below may reference images not displayed]

FINDINGS: Soft tissue swelling posteriorly and medially. No joint effusion,
fracture, or erosion.
IMPRESSION: 1. Soft tissue swelling without gas or opaque foreign body.
2. No joint effusion or osseous abnormality.

## 2021-03-22 ENCOUNTER — Emergency Department (HOSPITAL_COMMUNITY): Admission: EM | Admit: 2021-03-22 | Discharge: 2021-03-22 | Discharge status: Against Medical Advice | Payer: MEDICAID

## 2021-03-22 ENCOUNTER — Encounter (HOSPITAL_COMMUNITY): Payer: Self-pay | Admitting: *Deleted

## 2021-03-22 ENCOUNTER — Encounter (HOSPITAL_COMMUNITY): Payer: Self-pay

## 2021-03-22 ENCOUNTER — Ambulatory Visit (HOSPITAL_COMMUNITY): Admission: EM | Admit: 2021-03-22 | Discharge: 2021-03-22 | Discharge status: Against Medical Advice | Payer: Self-pay

## 2021-03-22 ENCOUNTER — Emergency Department (HOSPITAL_COMMUNITY)
Admission: EM | Admit: 2021-03-22 | Discharge: 2021-03-23 | Disposition: A | Discharge status: Home / Self Care | Payer: Self-pay | Attending: Emergency Medicine | Admitting: Emergency Medicine

## 2021-03-22 ENCOUNTER — Other Ambulatory Visit: Payer: Self-pay

## 2021-03-22 DIAGNOSIS — F209 Schizophrenia, unspecified: Secondary | ICD-10-CM

## 2021-03-22 DIAGNOSIS — L03115 Cellulitis of right lower limb: Secondary | ICD-10-CM | POA: Insufficient documentation

## 2021-03-22 DIAGNOSIS — Z5321 Procedure and treatment not carried out due to patient leaving prior to being seen by health care provider: Secondary | ICD-10-CM | POA: Insufficient documentation

## 2021-03-22 DIAGNOSIS — L03116 Cellulitis of left lower limb: Secondary | ICD-10-CM

## 2021-03-22 NOTE — Discharge Instructions (Addendum)
Advised to head to ED; patient left without being discharged

## 2021-03-22 NOTE — ED Notes (Signed)
Pt walked out of the room and stated he did not want to go to the hospital. Pt states he want ibuprofen, I told him I would check with the Medical Provider about providing him with Ibuprofen. Pt states he was going to leave and "walk some where".

## 2021-03-22 NOTE — ED Notes (Signed)
Pt called x4 for vital recheck. No response.

## 2021-03-22 NOTE — ED Provider Notes (Addendum)
Emergency Medicine Provider Triage Evaluation Note  Bradley Mcgrath , a 40 y.o. male  was evaluated in triage.  Pt complains of redness to the bilat lower extremities. Seen at urgent care pta and was felt to have cellulitis but he was noted to be responding to internal stimuli so he was sent here for further evaluation.  Review of Systems  Positive: Redness to the ble, Negative: Si, hi  Physical Exam  BP (!) 152/81 (BP Location: Left Arm)   Pulse 79   Temp 98.6 F (37 C) (Oral)   Resp 18   Ht 6' (1.829 m)   Wt 92 kg   SpO2 100%   BMI 27.51 kg/m  Gen:   Awake, no distress   Resp:  Normal effort  MSK:   Moves extremities without difficulty  Other:  Erythema and edema to the BLE  Medical Decision Making  Medically screening exam initiated at 9:06 PM.  Appropriate orders placed.  Bradley Mcgrath was informed that the remainder of the evaluation will be completed by another provider, this initial triage assessment does not replace that evaluation, and the importance of remaining in the ED until their evaluation is complete.  Attempted to order bloodwork but pt declines. He does not appear to meet criteria for ivc at this time   Karrie Meres, PA-C 03/22/21 2107    Karrie Meres, PA-C 03/22/21 2107    Sloan Leiter, DO 03/23/21 [phone]
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
## 2021-03-22 NOTE — ED Triage Notes (Signed)
Bilateral lower leg redness, swelling and pain x 2 weeks. Denies any fevers and chills.   Pt slow to answer questions, was seen at urgent care today.

## 2021-03-22 NOTE — ED Notes (Signed)
Pt walked out

## 2021-03-22 NOTE — ED Triage Notes (Signed)
C/O bilat lower leg pain x "days or weeks".  Redness and swelling noted to left distal lower leg. Pt muttering vulgar language, appearing as if he's having a conversation with another person.

## 2021-03-23 ENCOUNTER — Encounter (HOSPITAL_COMMUNITY): Payer: Medicaid Other

## 2021-03-23 ENCOUNTER — Emergency Department (HOSPITAL_COMMUNITY)
Admission: EM | Admit: 2021-03-23 | Discharge: 2021-03-23 | Disposition: A | Discharge status: Home / Self Care | Payer: Medicaid Other | Attending: Emergency Medicine | Admitting: Emergency Medicine

## 2021-03-23 ENCOUNTER — Encounter (HOSPITAL_COMMUNITY): Payer: Self-pay

## 2021-03-23 DIAGNOSIS — X58XXXA Exposure to other specified factors, initial encounter: Secondary | ICD-10-CM | POA: Insufficient documentation

## 2021-03-23 DIAGNOSIS — S90512A Abrasion, left ankle, initial encounter: Secondary | ICD-10-CM | POA: Insufficient documentation

## 2021-03-23 DIAGNOSIS — I10 Essential (primary) hypertension: Secondary | ICD-10-CM | POA: Insufficient documentation

## 2021-03-23 DIAGNOSIS — S80812A Abrasion, left lower leg, initial encounter: Secondary | ICD-10-CM | POA: Insufficient documentation

## 2021-03-23 DIAGNOSIS — Z79899 Other long term (current) drug therapy: Secondary | ICD-10-CM | POA: Insufficient documentation

## 2021-03-23 DIAGNOSIS — F17210 Nicotine dependence, cigarettes, uncomplicated: Secondary | ICD-10-CM | POA: Insufficient documentation

## 2021-03-23 DIAGNOSIS — L03116 Cellulitis of left lower limb: Secondary | ICD-10-CM | POA: Insufficient documentation

## 2021-03-23 LAB — CBC WITH DIFFERENTIAL/PLATELET
Abs Immature Granulocytes: 0.04 10*3/uL (ref 0.00–0.07)
Basophils Absolute: 0 10*3/uL (ref 0.0–0.1)
Basophils Relative: 1 %
Eosinophils Absolute: 0.1 10*3/uL (ref 0.0–0.5)
Eosinophils Relative: 2 %
HCT: 39 % (ref 39.0–52.0)
Hemoglobin: 13.4 g/dL (ref 13.0–17.0)
Immature Granulocytes: 1 %
Lymphocytes Relative: 15 %
Lymphs Abs: 1.1 10*3/uL (ref 0.7–4.0)
MCH: 30.5 pg (ref 26.0–34.0)
MCHC: 34.4 g/dL (ref 30.0–36.0)
MCV: 88.8 fL (ref 80.0–100.0)
Monocytes Absolute: 0.5 10*3/uL (ref 0.1–1.0)
Monocytes Relative: 7 %
Neutro Abs: 5.7 10*3/uL (ref 1.7–7.7)
Neutrophils Relative %: 74 %
Platelets: 263 10*3/uL (ref 150–400)
RBC: 4.39 MIL/uL (ref 4.22–5.81)
RDW: 12 % (ref 11.5–15.5)
WBC: 7.6 10*3/uL (ref 4.0–10.5)
nRBC: 0 % (ref 0.0–0.2)

## 2021-03-23 LAB — BASIC METABOLIC PANEL
Anion gap: 11 (ref 5–15)
BUN: 18 mg/dL (ref 6–20)
CO2: 24 mmol/L (ref 22–32)
Calcium: 8.6 mg/dL — ABNORMAL LOW (ref 8.9–10.3)
Chloride: 100 mmol/L (ref 98–111)
Creatinine, Ser: 0.89 mg/dL (ref 0.61–1.24)
GFR, Estimated: >60 mL/min (ref >60)
Glucose, Bld: 186 mg/dL — ABNORMAL HIGH (ref 70–99)
Potassium: 2.7 mmol/L — CL (ref 3.5–5.1)
Sodium: 135 mmol/L (ref 135–145)

## 2021-03-23 MED ORDER — NAPROXEN 250 MG PO TABS
500.0000 mg | ORAL_TABLET | Freq: Once | ORAL | Status: AC
  Administered 2021-03-23: 500 mg via ORAL
  Filled 2021-03-23: qty 2

## 2021-03-23 MED ORDER — DOXYCYCLINE HYCLATE 100 MG PO TABS
100.0000 mg | ORAL_TABLET | Freq: Once | ORAL | Status: AC
  Administered 2021-03-23: 100 mg via ORAL
  Filled 2021-03-23: qty 1

## 2021-03-23 MED ORDER — POTASSIUM CHLORIDE 20 MEQ PO PACK
40.0000 meq | PACK | Freq: Once | ORAL | Status: AC
  Administered 2021-03-23: 40 meq via ORAL
  Filled 2021-03-23: qty 2

## 2021-03-23 MED ORDER — POTASSIUM CHLORIDE ER 10 MEQ PO TBCR: 10.0000 meq | EXTENDED_RELEASE_TABLET | Freq: Two times a day (BID) | ORAL | 0 refills | Status: DC

## 2021-03-23 MED ORDER — DOXYCYCLINE HYCLATE 100 MG PO CAPS: 100.0000 mg | ORAL_CAPSULE | Freq: Two times a day (BID) | ORAL | 0 refills | Status: DC

## 2021-03-23 NOTE — ED Notes (Signed)
PA Theron Arista B. Notified of critical potassium
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
## 2021-03-23 NOTE — ED Notes (Signed)
Pt has 2+ left lower leg swelling. Both lower legs are erythematous, both hot to touch. PT has 2+ pedal pulses bilat, cap refill less than 3 sec. Pt able to ambulate to bathroom.

## 2021-03-23 NOTE — Discharge Instructions (Signed)
Your presentation seems most consistent with a skin infection of your lower legs.  However, we cannot completely rule out a blood clot in your left lower leg, although it is unlikely.  We cannot obtain an ultrasound tonight, so I have ordered an outpatient ultrasound to be completed tomorrow back here at [facility].  Please come back tomorrow morning to have the study completed.  If you develop any shortness of breath or chest pain, please come back to the ED at once.

## 2021-03-23 NOTE — ED Provider Notes (Signed)
Emergency Medicine Provider Triage Evaluation Note  ELIMELECH HOUSEMAN , a 40 y.o. male  was evaluated in triage.  Pt complains of swelling and erythema to bilateral lower extremities.  Patient stated 5 days prior.  States that her symptoms have gotten worse over this time period.  Patient denies any recent injuries.  Patient denies any fevers, chills, numbness, weakness.   Review of Systems  Positive: Bilateral lower leg swelling and erythema Negative: Numbness, weakness, fever, chills, SI, HI, AVH  Physical Exam  BP 135/84 (BP Location: Left Arm)   Pulse 97   Temp 98.5 F (36.9 C) (Oral)   Resp 18   Ht 6' (1.829 m)   Wt 82.6 kg   SpO2 100%   BMI 24.68 kg/m  Gen:   Awake, no distress   Resp:  Normal effort  MSK:   Moves extremities without difficulty, erythema and edema to bilateral lower extremities Other:  Patient appears to be responding to internal stimuli  Medical Decision Making  Medically screening exam initiated at 2:39 PM.  Appropriate orders placed.  TAYO MAUTE was informed that the remainder of the evaluation will be completed by another provider, this initial triage assessment does not replace that evaluation, and the importance of remaining in the ED until their evaluation is complete.  The patient appears stable so that the remainder of the work up may be completed by another provider.      Haskel Schroeder, PA-C 03/23/21 1441    Pollyann Savoy, MD 03/23/21 1547

## 2021-03-23 NOTE — ED Triage Notes (Signed)
Pt presents with bilateral redness and swelling in lower extremities. States this began five days ago. Denies fever.

## 2021-03-23 NOTE — ED Provider Notes (Signed)
I saw and evaluated the patient, reviewed the resident's note and I agree with the findings and plan.  Pertinent History: hx of low K, poly SA, redness and swellign bilateral LE's for 5 days - no hx of PE / DVT, no risk for same -   Pertinent Exam findings: Left leg with LL discrepancy - warm red ankles bilaterally - LLE with pitting edema - the left leg is more red and swollen than the R - there is a wound on the back of the Left Achilles area -   I was personally present and directly supervised the following procedures:  Medical evaluation Ultrasound of the leg Initiated antibiotics for possible cellulitis Patient otherwise well-appearing without tachycardia or fever  I personally interpreted the EKG as well as the resident and agree with the interpretation on the resident's chart.  Final diagnoses:  None      Eber Hong, MD 03/25/21 1237

## 2021-03-24 ENCOUNTER — Ambulatory Visit (HOSPITAL_COMMUNITY): Payer: Self-pay | Attending: Emergency Medicine

## 2021-04-11 ENCOUNTER — Emergency Department (HOSPITAL_COMMUNITY): Payer: Medicaid Other

## 2021-04-11 ENCOUNTER — Encounter (HOSPITAL_COMMUNITY): Payer: Self-pay

## 2021-04-11 ENCOUNTER — Emergency Department (HOSPITAL_COMMUNITY)
Admission: EM | Admit: 2021-04-11 | Discharge: 2021-04-12 | Disposition: A | Discharge status: Home / Self Care | Payer: Self-pay | Attending: Emergency Medicine | Admitting: Emergency Medicine

## 2021-04-11 ENCOUNTER — Emergency Department (HOSPITAL_COMMUNITY)
Admission: EM | Admit: 2021-04-11 | Discharge: 2021-04-11 | Discharge status: Against Medical Advice | Payer: Medicaid Other | Attending: Emergency Medicine | Admitting: Emergency Medicine

## 2021-04-11 DIAGNOSIS — S73004D Unspecified dislocation of right hip, subsequent encounter: Secondary | ICD-10-CM | POA: Insufficient documentation

## 2021-04-11 DIAGNOSIS — I10 Essential (primary) hypertension: Secondary | ICD-10-CM | POA: Insufficient documentation

## 2021-04-11 DIAGNOSIS — F17210 Nicotine dependence, cigarettes, uncomplicated: Secondary | ICD-10-CM | POA: Insufficient documentation

## 2021-04-11 DIAGNOSIS — Z79899 Other long term (current) drug therapy: Secondary | ICD-10-CM | POA: Insufficient documentation

## 2021-04-11 DIAGNOSIS — X58XXXA Exposure to other specified factors, initial encounter: Secondary | ICD-10-CM | POA: Insufficient documentation

## 2021-04-11 DIAGNOSIS — S73034A Other anterior dislocation of right hip, initial encounter: Secondary | ICD-10-CM

## 2021-04-11 DIAGNOSIS — X58XXXD Exposure to other specified factors, subsequent encounter: Secondary | ICD-10-CM | POA: Insufficient documentation

## 2021-04-11 MED ORDER — PROPOFOL 10 MG/ML IV BOLUS
0.5000 mg/kg | Freq: Once | INTRAVENOUS | Status: DC
  Filled 2021-04-11: qty 20

## 2021-04-11 MED ORDER — ACETAMINOPHEN 500 MG PO TABS
1000.0000 mg | ORAL_TABLET | Freq: Once | ORAL | Status: AC
  Administered 2021-04-11: 1000 mg via ORAL
  Filled 2021-04-11: qty 2

## 2021-04-11 MED ORDER — KETAMINE HCL 50 MG/5ML IJ SOSY
80.0000 mg | PREFILLED_SYRINGE | Freq: Once | INTRAMUSCULAR | Status: DC
  Filled 2021-04-11: qty 10

## 2021-04-11 NOTE — Discharge Instructions (Addendum)
Your hip is dislocated.  I offered to attempt to put a back into place for you.  Please return if you change your mind and would like this done.  Otherwise please follow-up with the orthopedic surgeon in the office.

## 2021-04-11 NOTE — ED Triage Notes (Signed)
Pt c/o R hip pain, states he was seen for same earlier tonight but "didn't like the doctor & wants another." See previous RN triage note
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
## 2021-04-11 NOTE — ED Notes (Signed)
Pt reports hx of R hip popping out multiple times
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
## 2021-04-12 ENCOUNTER — Emergency Department (HOSPITAL_COMMUNITY): Payer: Self-pay

## 2021-04-12 ENCOUNTER — Other Ambulatory Visit: Payer: Self-pay

## 2021-04-12 LAB — CBC WITH DIFFERENTIAL/PLATELET
Abs Immature Granulocytes: 0.07 10*3/uL (ref 0.00–0.07)
Basophils Absolute: 0 10*3/uL (ref 0.0–0.1)
Basophils Relative: 0 %
Eosinophils Absolute: 0 10*3/uL (ref 0.0–0.5)
Eosinophils Relative: 0 %
HCT: 36.2 % — ABNORMAL LOW (ref 39.0–52.0)
Hemoglobin: 12.6 g/dL — ABNORMAL LOW (ref 13.0–17.0)
Immature Granulocytes: 1 %
Lymphocytes Relative: 14 %
Lymphs Abs: 1.4 10*3/uL (ref 0.7–4.0)
MCH: 30.6 pg (ref 26.0–34.0)
MCHC: 34.8 g/dL (ref 30.0–36.0)
MCV: 87.9 fL (ref 80.0–100.0)
Monocytes Absolute: 0.8 10*3/uL (ref 0.1–1.0)
Monocytes Relative: 8 %
Neutro Abs: 8 10*3/uL — ABNORMAL HIGH (ref 1.7–7.7)
Neutrophils Relative %: 77 %
Platelets: 235 10*3/uL (ref 150–400)
RBC: 4.12 MIL/uL — ABNORMAL LOW (ref 4.22–5.81)
RDW: 12 % (ref 11.5–15.5)
WBC: 10.3 10*3/uL (ref 4.0–10.5)
nRBC: 0 % (ref 0.0–0.2)

## 2021-04-12 LAB — BASIC METABOLIC PANEL
Anion gap: 9 (ref 5–15)
BUN: 9 mg/dL (ref 6–20)
CO2: 26 mmol/L (ref 22–32)
Calcium: 8.3 mg/dL — ABNORMAL LOW (ref 8.9–10.3)
Chloride: 100 mmol/L (ref 98–111)
Creatinine, Ser: 0.77 mg/dL (ref 0.61–1.24)
GFR, Estimated: >60 mL/min (ref >60)
Glucose, Bld: 96 mg/dL (ref 70–99)
Potassium: 4.1 mmol/L (ref 3.5–5.1)
Sodium: 135 mmol/L (ref 135–145)

## 2021-04-12 MED ORDER — FENTANYL CITRATE PF 50 MCG/ML IJ SOSY
100.0000 ug | PREFILLED_SYRINGE | Freq: Once | INTRAMUSCULAR | Status: AC
  Administered 2021-04-12: 100 ug via INTRAVENOUS
  Filled 2021-04-12: qty 2

## 2021-04-12 MED ORDER — PROPOFOL 10 MG/ML IV BOLUS
INTRAVENOUS | Status: AC | PRN
  Administered 2021-04-12: 20 mg via INTRAVENOUS

## 2021-04-12 MED ORDER — ACETAMINOPHEN 325 MG PO TABS
650.0000 mg | ORAL_TABLET | Freq: Once | ORAL | Status: AC
  Administered 2021-04-12: 650 mg via ORAL
  Filled 2021-04-12: qty 2

## 2021-04-12 MED ORDER — MIDAZOLAM HCL 2 MG/2ML IJ SOLN
4.0000 mg | Freq: Once | INTRAMUSCULAR | Status: AC
  Administered 2021-04-12: 2 mg via INTRAVENOUS
  Filled 2021-04-12: qty 4

## 2021-04-12 MED ORDER — PROPOFOL 10 MG/ML IV BOLUS
100.0000 mg | Freq: Once | INTRAVENOUS | Status: AC
  Administered 2021-04-12: 20 mg via INTRAVENOUS
  Filled 2021-04-12: qty 20

## 2021-04-12 NOTE — ED Provider Notes (Signed)
Medical screening examination/treatment/procedure(s) were conducted as a shared visit with non-physician practitioner(s) and myself.  I personally evaluated the patient during the encounter.  Clinical Impression:   Final diagnoses:  Dislocation of right hip, subsequent encounter    .Sedation  Date/Time: 04/12/2021 11:59 AM Performed by: Eber Hong, MD Authorized by: Eber Hong, MD   Consent:    Consent obtained:  Verbal and written   Consent given by:  Patient   Risks discussed:  Allergic reaction, dysrhythmia, inadequate sedation, nausea, vomiting, respiratory compromise necessitating ventilatory assistance and intubation, prolonged sedation necessitating reversal and prolonged hypoxia resulting in organ damage Universal protocol:    Procedure explained and questions answered to patient or proxy's satisfaction: yes     Relevant documents present and verified: yes     Imaging studies available: yes     Required blood products, implants, devices, and special equipment available: yes     Site/side marked: yes     Immediately prior to procedure, a time out was called: yes   Pre-sedation assessment:    Time since last food or drink:  Unknown   NPO status caution: unable to specify NPO status     ASA classification: class 2 - patient with mild systemic disease     Mallampati score:  II - soft palate, uvula, fauces visible   Neck mobility: normal     Pre-sedation assessments completed and reviewed: airway patency, cardiovascular function, hydration status, mental status, nausea/vomiting, pain level and temperature   Procedure details (see MAR for exact dosages):    Total Provider sedation time (minutes):  20    Eber Hong, MD 04/23/21 [phone]

## 2021-04-12 NOTE — Progress Notes (Signed)
Orthopedic Tech Progress Note Patient Details:  Bradley Mcgrath 17-Aug-1980 [id number] MD did a reduction of the HIP I was asked to apply a KNEE IMMOBILIZER on afterwards     Ortho Devices Type of Ortho Device: Knee Immobilizer Ortho Device/Splint Location: RLE Ortho Device/Splint Interventions: Application, Adjustment   Post Interventions Patient Tolerated: Fair, Well Instructions Provided: Care of device, Poper ambulation with device  Donald Pore 04/12/2021, 10:48 AM

## 2021-04-12 NOTE — ED Notes (Signed)
Pt states he thinks his hip "popped out" on Monday, He says that he just woke up on the floor. Was seen yesterday but left because the doctor "wanted to give him cat tranquilizer so I left and waiting until a new doctor came on"

## 2021-04-12 NOTE — ED Notes (Signed)
Pt refusing social works ride. MD made aware and approves of discharging pt at this time
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
## 2021-04-12 NOTE — Progress Notes (Signed)
CSW tried to assist patient with a ride back home. Patient stated he needs a ride to the grocery first and then to his home. CSW stated she can provide transportation to his home only. Patient declined because he stated he needed food before he goes home. Patient stated he was going to call his sister.

## 2021-04-12 NOTE — ED Notes (Signed)
Pt will work with social works ride program to get home after the sedation and reduction.

## 2021-04-12 NOTE — Discharge Instructions (Addendum)
You were seen and evaluated in the emergency department today for further evaluation of right hip dislocation.  As we discussed, the reduction was successful. Tylenol as needed for pain. Please return to the emergency department if you experience another dislocation, worsening pain, numbness, weakness down the right leg, or any other concerns you might have. Please follow up with orthopedics to establish care.

## 2021-05-30 ENCOUNTER — Telehealth: Payer: Self-pay | Admitting: Orthopedic Surgery

## 2021-05-30 NOTE — Telephone Encounter (Signed)
Patient inquiring about appointment for potential hip revision surgery. States he also would like to be referred to a pain management clinic; said had been referred to one in the past. States is in process of applying for disability. I relayed that I will check with our practice administrator, but relayed that our clinic does not treat for the above.  Please advise.

## 2021-06-07 ENCOUNTER — Encounter (HOSPITAL_COMMUNITY): Payer: Self-pay

## 2021-06-07 ENCOUNTER — Emergency Department (HOSPITAL_COMMUNITY)
Admission: EM | Admit: 2021-06-07 | Discharge: 2021-06-07 | Disposition: A | Discharge status: Home / Self Care | Payer: Medicaid Other | Attending: Emergency Medicine | Admitting: Emergency Medicine

## 2021-06-07 DIAGNOSIS — Z79899 Other long term (current) drug therapy: Secondary | ICD-10-CM | POA: Insufficient documentation

## 2021-06-07 DIAGNOSIS — M79604 Pain in right leg: Secondary | ICD-10-CM | POA: Insufficient documentation

## 2021-06-07 DIAGNOSIS — I10 Essential (primary) hypertension: Secondary | ICD-10-CM | POA: Insufficient documentation

## 2021-06-07 DIAGNOSIS — F17210 Nicotine dependence, cigarettes, uncomplicated: Secondary | ICD-10-CM | POA: Insufficient documentation

## 2021-06-07 DIAGNOSIS — G8929 Other chronic pain: Secondary | ICD-10-CM | POA: Insufficient documentation

## 2021-06-07 MED ORDER — GABAPENTIN 300 MG PO CAPS
300.0000 mg | ORAL_CAPSULE | Freq: Once | ORAL | Status: AC
  Administered 2021-06-07: 300 mg via ORAL
  Filled 2021-06-07: qty 1

## 2021-06-07 MED ORDER — GABAPENTIN 300 MG PO CAPS: 300.0000 mg | ORAL_CAPSULE | Freq: Three times a day (TID) | ORAL | 0 refills | Status: DC
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
## 2021-06-07 NOTE — Discharge Instructions (Signed)
I have given you [place] primary care for follow-up.  They are affiliated with the [facility] health system.  I also have given you a GoodRx coupon.  Please present the coupon when you fill your medications for a significant discount.  Please return to the emergency department if you experience worsening pain, weakness/numbness to the lower extremities, or any other concerns you might have.
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
## 2021-09-07 ENCOUNTER — Encounter (HOSPITAL_COMMUNITY): Payer: Self-pay

## 2021-09-07 ENCOUNTER — Emergency Department (HOSPITAL_COMMUNITY)
Admission: EM | Admit: 2021-09-07 | Discharge: 2021-09-08 | Disposition: A | Discharge status: Home / Self Care | Payer: Medicaid Other | Attending: Emergency Medicine | Admitting: Emergency Medicine

## 2021-09-07 ENCOUNTER — Other Ambulatory Visit: Payer: Self-pay

## 2021-09-07 DIAGNOSIS — R202 Paresthesia of skin: Secondary | ICD-10-CM | POA: Insufficient documentation

## 2021-09-07 DIAGNOSIS — M79671 Pain in right foot: Secondary | ICD-10-CM | POA: Insufficient documentation

## 2021-09-07 DIAGNOSIS — I10 Essential (primary) hypertension: Secondary | ICD-10-CM | POA: Insufficient documentation

## 2021-09-07 DIAGNOSIS — F17210 Nicotine dependence, cigarettes, uncomplicated: Secondary | ICD-10-CM | POA: Insufficient documentation

## 2021-09-07 MED ORDER — GABAPENTIN 100 MG PO CAPS
100.0000 mg | ORAL_CAPSULE | Freq: Once | ORAL | Status: AC
  Administered 2021-09-07: 100 mg via ORAL
  Filled 2021-09-07: qty 1

## 2021-09-07 MED ORDER — GABAPENTIN 100 MG PO CAPS: 100.0000 mg | ORAL_CAPSULE | Freq: Three times a day (TID) | ORAL | 0 refills | Status: DC

## 2021-09-07 NOTE — ED Provider Notes (Signed)
Emergency Department Provider Note  I have reviewed the triage vital signs and the nursing notes.  HISTORY  Chief Complaint No chief complaint on file.   HPI Bradley Mcgrath is a 41 y.o. male with chronic right leg paresthesias and slightly worsening. No new trauma, fevers, recent illnesses. Pain has been present for years. Has been seen here for similar episodes in the past but couldn't afford follow up care or medications. Is interested in both right now. States it is burning in his right lateral foot that goes up his leg. No rashes.   PMH Past Medical History:  Diagnosis Date   Depression    Hypertension    Kidney stones    Schizophrenia (HCC)      No other associated or modifying symptoms.    Social History Social History   Tobacco Use   Smoking status: Every Day    Packs/day: 1.00    Types: Cigarettes   Smokeless tobacco: Never  Vaping Use   Vaping Use: Never used  Substance Use Topics   Alcohol use: Not Currently   Drug use: Not Currently    Frequency: 2.0 times per week    Types: Marijuana    Review of Systems  All other systems negative except as documented in the HPI. All pertinent positives and negatives as reviewed in the HPI. ____________________________________________  PHYSICAL EXAM: VITAL SIGNS: ED Triage Vitals  Enc Vitals Group     BP 09/07/21 2159 (!) 153/91     Pulse Rate 09/07/21 2159 91     Resp 09/07/21 2159 17     Temp 09/07/21 2159 98.9 F (37.2 C)     Temp Source 09/07/21 2159 Oral     SpO2 09/07/21 2159 95 %     Weight 09/07/21 2157 210 lb (95.3 kg)     Height 09/07/21 2157 6' (1.829 m)   Physical Exam Vitals and nursing note reviewed.  Constitutional:      Appearance: He is well-developed.  HENT:     Head: Normocephalic and atraumatic.     Nose: No congestion or rhinorrhea.     Mouth/Throat:     Mouth: Mucous membranes are moist.     Pharynx: Oropharynx is clear.  Eyes:     Pupils: Pupils are equal, round, and  reactive to light.  Cardiovascular:     Rate and Rhythm: Normal rate.  Pulmonary:     Effort: Pulmonary effort is normal. No respiratory distress.  Abdominal:     General: Abdomen is flat. There is no distension.  Musculoskeletal:        General: No swelling or tenderness. Normal range of motion.     Cervical back: Normal range of motion.  Skin:    General: Skin is warm and dry.  Neurological:     General: No focal deficit present.     Mental Status: He is alert.     Comments: Ambulates without much difficulty.       ____________________________________________   LABS (all labs ordered are listed, but only abnormal results are displayed)  Labs Reviewed - No data to display ____________________________________________  EKG  none ____________________________________________  RADIOLOGY  No results found. ____________________________________________  PROCEDURES  Procedure(s) performed:   Procedures ____________________________________________  INITIAL IMPRESSION / ASSESSMENT AND PLAN   This patient presents to the ED for concern of right eg ain, this involves an extensive number of treatment options, and is a complaint that carries with it a high risk of complications and morbidity.  The differential diagnosis includes radiculopathy from sciatica, cellulitis, muscle strain/sprain.  Additional history obtained:  Additional history obtained from noone Previous records obtained and reviewed in epic  Co morbidities that complicate the patient evaluation  Right hip AVN and recurrent disocations  Social Determinants of Health:  Homeless, restricted insurance, no disabiity  ED Course  Images ordered viewed and obtained by myself. Agree with Radiology interpretation. Details in ED course.  Labs ordered reviewed by myself as detailed in ED course.  Consultations obtained/considered detailed in ED course.    CRITICAL  INTERVENTIONS:  none  Reevaluation:  After the interventions noted above, I reevaluated the patient and found that they have :improved  FINAL IMPRESSION AND PLAN Final diagnoses:  Right foot pain   Suspect chronic leg paresthesias. No e/o cauda equina, infection or other emergent causes. Will attempt Rx for symptom control and offer numbers for PCP follow up.   A medical screening exam was performed and I feel the patient has had an appropriate workup for their chief complaint at this time and likelihood of emergent condition existing is low. They have been counseled on decision, DISCHARGE, follow up and which symptoms necessitate immediate return to the emergency department. They or their family verbally stated understanding and agreement with plan and discharged in stable condition.   ____________________________________________   NEW OUTPATIENT MEDICATIONS STARTED DURING THIS VISIT:  New Prescriptions   GABAPENTIN (NEURONTIN) 100 MG CAPSULE    Take 1 capsule (100 mg total) by mouth 3 (three) times daily.    Note:  This note was prepared with assistance of Dragon voice recognition software. Occasional wrong-word or sound-a-like substitutions may have occurred due to the inherent limitations of voice recognition software.    Marily Memos, MD 09/07/21 [phone]

## 2021-09-07 NOTE — ED Triage Notes (Signed)
Pt c/o right foot pain that has going on for years. Pt states the pain tonight is worse than usual, says it feels like pins and needles in his toes.   Pt says he was given a rx for nerve pain a few months ago but didn't have the financial resources to get the medication

## 2022-06-21 ENCOUNTER — Emergency Department (HOSPITAL_COMMUNITY): Payer: Self-pay

## 2022-06-21 ENCOUNTER — Emergency Department (HOSPITAL_COMMUNITY)
Admission: EM | Admit: 2022-06-21 | Discharge: 2022-06-22 | Disposition: A | Discharge status: Home / Self Care | Payer: Self-pay | Attending: Emergency Medicine | Admitting: Emergency Medicine

## 2022-06-21 ENCOUNTER — Other Ambulatory Visit: Payer: Self-pay

## 2022-06-21 ENCOUNTER — Encounter (HOSPITAL_COMMUNITY): Payer: Self-pay

## 2022-06-21 DIAGNOSIS — X58XXXA Exposure to other specified factors, initial encounter: Secondary | ICD-10-CM | POA: Insufficient documentation

## 2022-06-21 DIAGNOSIS — S73004A Unspecified dislocation of right hip, initial encounter: Secondary | ICD-10-CM | POA: Insufficient documentation

## 2022-06-21 DIAGNOSIS — I10 Essential (primary) hypertension: Secondary | ICD-10-CM | POA: Insufficient documentation

## 2022-06-21 MED ORDER — PROPOFOL 10 MG/ML IV BOLUS
0.5000 mg/kg | Freq: Once | INTRAVENOUS | Status: AC
  Administered 2022-06-22: 39.7 mg via INTRAVENOUS
  Filled 2022-06-21: qty 20

## 2022-06-21 MED ORDER — PROPOFOL 10 MG/ML IV BOLUS
INTRAVENOUS | Status: AC | PRN
  Administered 2022-06-21 (×2): 20 mg via INTRAVENOUS
  Administered 2022-06-21: 40 mg via INTRAVENOUS

## 2022-06-21 NOTE — ED Triage Notes (Signed)
Pt states his right hip is dislocated. Pt states he has had this before and it feels the same. Pt denies numbness, has pain in right hip.

## 2022-06-21 NOTE — ED Provider Notes (Signed)
[facility] EMERGENCY DEPARTMENT Provider Note   CSN: [id number] Arrival date & time: 06/21/22  2054     History  Chief Complaint  Patient presents with   Hip Pain    Bradley Mcgrath is a 41 y.o. male.  The history is provided by the patient.  Hip Pain  He has history of hypertension, schizophrenia, right hip total arthroplasty and comes in after spontaneously dislocating his right hip.  He has had problems with recurrent dislocations.  He states he was sitting on a curb and it popped out of place.  He denies any drug use or ethanol today.   Home Medications Prior to Admission medications   Medication Sig Start Date End Date Taking? Authorizing Provider  gabapentin (NEURONTIN) 100 MG capsule Take 1 capsule (100 mg total) by mouth 3 (three) times daily. 09/07/21 11/06/21  Mesner, Barbara Cower, MD      Allergies    Patient has no known allergies.    Review of Systems   Review of Systems  All other systems reviewed and are negative.   Physical Exam Updated Vital Signs BP 139/74   Pulse 89   Temp 98.8 F (37.1 C) (Oral)   Resp 16   Ht 6' (1.829 m)   Wt 79.4 kg   SpO2 96%   BMI 23.73 kg/m  Physical Exam Vitals and nursing note reviewed.   41 year old male, resting comfortably and in no acute distress. Vital signs are normal. Oxygen saturation is 96%, which is normal. Head is normocephalic and atraumatic. PERRLA, EOMI.  Neck is nontender and supple without adenopathy or JVD. Back is nontender and there is no CVA tenderness. Lungs are clear without rales, wheezes, or rhonchi. Chest is nontender. Heart has regular rate and rhythm without murmur. Abdomen is soft, flat, nontender. Extremities: Right leg is shortened and internally rotated. Skin is warm and dry without rash. Neurologic: Mental status is normal, cranial nerves are intact, moves all extremities equally except for the right leg..  ED Results / Procedures / Treatments   Labs (all labs ordered  are listed, but only abnormal results are displayed) Labs Reviewed - No data to display  EKG None  Radiology DG Hip Unilat W or Wo Pelvis 2-3 Views Right  Result Date: 06/21/2022 CLINICAL DATA:  Hip pain. EXAM: DG HIP (WITH OR WITHOUT PELVIS) 2-3V RIGHT COMPARISON:  April 12, 2021 FINDINGS: A right total hip replacement is seen. There is no evidence of surrounding lucency to suggest the presence of hardware loosening or infection. There is widening of the joint space separating the right acetabular and right femoral prostheses. Inferior medial displacement of the femoral head prosthesis is also noted. There is no evidence of an acute fracture. A chronic deformity is again seen along the right inferior pubic ramus. IMPRESSION: 1. Right total hip replacement with inferior medial dislocation of the right femoral head prosthesis. Electronically Signed   By: Aram Candela M.D.   On: 06/21/2022 22:06    Procedures Reduction of dislocation  Date/Time: 06/22/2022 12:01 AM  Performed by: Dione Booze, MD Authorized by: Dione Booze, MD  Consent: Verbal consent obtained. Written consent not obtained. Risks and benefits: risks, benefits and alternatives were discussed Consent given by: patient Patient understanding: patient states understanding of the procedure being performed Patient consent: the patient's understanding of the procedure matches consent given Procedure consent: procedure consent matches procedure scheduled Relevant documents: relevant documents present and verified Test results: test results available and properly labeled  Site marked: the operative site was marked Imaging studies: imaging studies available Required items: required blood products, implants, devices, and special equipment available Patient identity confirmed: arm band Time out: Immediately prior to procedure a "time out" was called to verify the correct patient, procedure, equipment, support staff and site/side  marked as required. Local anesthesia used: no  Anesthesia: Local anesthesia used: no  Sedation: Patient sedated: yes Sedatives: propofol Sedation start date/time: 06/21/2022 11:50 PM Sedation end date/time: 06/22/2022 12:20 AM  Patient tolerance: patient tolerated the procedure well with no immediate complications   .Sedation  Date/Time: 06/22/2022 12:03 AM  Performed by: Dione Booze, MD Authorized by: Dione Booze, MD   Consent:    Consent obtained:  Verbal   Consent given by:  Patient   Risks discussed:  Allergic reaction, prolonged hypoxia resulting in organ damage, dysrhythmia, nausea and vomiting   Alternatives discussed:  Analgesia without sedation Universal protocol:    Procedure explained and questions answered to patient or proxy's satisfaction: yes     Relevant documents present and verified: yes     Test results available: yes     Imaging studies available: yes     Required blood products, implants, devices, and special equipment available: yes     Site/side marked: yes     Immediately prior to procedure, a time out was called: yes     Patient identity confirmed:  Verbally with patient and arm band Indications:    Procedure performed:  Dislocation reduction   Procedure necessitating sedation performed by:  Physician performing sedation Pre-sedation assessment:    Time since last food or drink:  6 hours   ASA classification: class 1 - normal, healthy patient     Mouth opening:  3 or more finger widths   Thyromental distance:  4 finger widths   Mallampati score:  I - soft palate, uvula, fauces, pillars visible   Neck mobility: normal     Pre-sedation assessments completed and reviewed: airway patency, cardiovascular function, hydration status, mental status, nausea/vomiting, pain level, respiratory function and temperature     Pre-sedation assessment completed:  06/21/2022 11:50 PM Immediate pre-procedure details:    Reassessment: Patient reassessed immediately  prior to procedure     Reviewed: vital signs, relevant labs/tests and NPO status     Verified: bag valve mask available, emergency equipment available, intubation equipment available, IV patency confirmed, oxygen available and reversal medications available   Procedure details (see MAR for exact dosages):    Preoxygenation:  Nonrebreather mask   Sedation:  Propofol   Intended level of sedation: deep   Analgesia:  None   Intra-procedure monitoring:  Blood pressure monitoring, continuous capnometry, frequent LOC assessments, continuous pulse oximetry and cardiac monitor   Intra-procedure events: none     Total Provider sedation time (minutes):  30 Post-procedure details:    Post-sedation assessment completed:  06/22/2022 12:20 AM   Attendance: Constant attendance by certified staff until patient recovered     Recovery: Patient returned to pre-procedure baseline     Post-sedation assessments completed and reviewed: airway patency, cardiovascular function, hydration status, mental status, nausea/vomiting, pain level, respiratory function and temperature     Patient is stable for discharge or admission: yes     Procedure completion:  Tolerated well, no immediate complications   Cardiac monitor shows normal sinus rhythm, per my interpretation.  Medications Ordered in ED Medications  propofol (DIPRIVAN) 10 mg/mL bolus/IV push 39.7 mg (has no administration in time range)    ED Course/  Medical Decision Making/ A&P                           Medical Decision Making Amount and/or Complexity of Data Reviewed Radiology: ordered.   Spontaneous right prosthetic hip dislocation.  X-rays confirm inferior medial dislocation of the prosthetic right femoral head.  I have independently viewed the images, and agree with the radiologist's interpretation.  I reviewed his old records, and he has ED visits for prosthetic hip dislocation on 04/11/2021, 11/22/2017.  He also has a hospitalization on 12/28/2016 for  prosthetic hip dislocation with inability to reduce it in the emergency department.  Plan is for procedural sedation with propofol and attempt at closed reduction.  Dislocation reduction was difficult but appeared to be successful.  There was good range of motion of the hip following the procedure.  Postprocedure x-ray has been ordered.  Knee immobilizer has been ordered.  Post procedure x-ray shows successful reduction.  I have independently viewed the images, and agree with radiologist interpretation.  The original total hip arthroplasty was done at [facility], but patient states that he has not followed up there in many years.  I have referred him to on-call orthopedics.  He is to wear the knee immobilizer at all times until he is seen by the orthopedic physician.  I have informed him that he needs to get established with an orthopedic physician locally.  Final Clinical Impression(s) / ED Diagnoses Final diagnoses:  Closed dislocation of right hip, initial encounter [facility])    Rx / DC Orders ED Discharge Orders     None         Dione Booze, MD 06/22/22 0102

## 2022-06-21 NOTE — ED Provider Triage Note (Signed)
Emergency Medicine Provider Triage Evaluation Note  Bradley Mcgrath , a 41 y.o. male  was evaluated in triage.  Pt complains of right hip pain since getting off a curb this afternoon. 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 Last dislocated last year, reduced manually at [facility]. No other pain/injuries noted today.   Review of Systems  Positive: Right hip pain Negative:   Physical Exam  BP 139/74   Pulse 89   Temp 98.8 F (37.1 C) (Oral)   Resp 16   Ht 6' (1.829 m)   Wt 79.4 kg   SpO2 96%   BMI 23.73 kg/m  Gen:   Awake, no distress   Resp:  Normal effort  MSK:   Moves extremities without difficulty  Other:  Tenderness over right hip, no crepitus to palpation of pelvis, normal sensation  Medical Decision Making  Medically screening exam initiated at 9:32 PM.  Appropriate orders placed.  Bradley Mcgrath was informed that the remainder of the evaluation will be completed by another provider, this initial triage assessment does not replace that evaluation, and the importance of remaining in the ED until their evaluation is complete.  Imaging ordered   Su Monks, PA-C 06/21/22 2134

## 2022-06-21 NOTE — ED Notes (Addendum)
Patient signed consent form for reduction of right hip dislocation under conscious sedation .

## 2022-06-22 ENCOUNTER — Emergency Department (HOSPITAL_COMMUNITY): Payer: Self-pay

## 2022-06-22 NOTE — ED Provider Notes (Incomplete)
[facility] EMERGENCY DEPARTMENT Provider Note   CSN: [id number] Arrival date & time: 06/21/22  2054     History {Add pertinent medical, surgical, social history, OB history to HPI:1} Chief Complaint  Patient presents with  . Hip Pain    Bradley Mcgrath is a 41 y.o. male.  The history is provided by the patient.  Hip Pain  He has history of hypertension, schizophrenia, right hip total arthroplasty and comes in after spontaneously dislocating his right hip.  He has had problems with recurrent dislocations.  He states he was sitting on a curb and it popped out of place.  He denies any drug use or ethanol today.   Home Medications Prior to Admission medications   Medication Sig Start Date End Date Taking? Authorizing Provider  gabapentin (NEURONTIN) 100 MG capsule Take 1 capsule (100 mg total) by mouth 3 (three) times daily. 09/07/21 11/06/21  Mesner, Barbara Cower, MD      Allergies    Patient has no known allergies.    Review of Systems   Review of Systems  All other systems reviewed and are negative.   Physical Exam Updated Vital Signs BP 139/74   Pulse 89   Temp 98.8 F (37.1 C) (Oral)   Resp 16   Ht 6' (1.829 m)   Wt 79.4 kg   SpO2 96%   BMI 23.73 kg/m  Physical Exam Vitals and nursing note reviewed.   41 year old male, resting comfortably and in no acute distress. Vital signs are normal. Oxygen saturation is 96%, which is normal. Head is normocephalic and atraumatic. PERRLA, EOMI.  Neck is nontender and supple without adenopathy or JVD. Back is nontender and there is no CVA tenderness. Lungs are clear without rales, wheezes, or rhonchi. Chest is nontender. Heart has regular rate and rhythm without murmur. Abdomen is soft, flat, nontender. Extremities: Right leg is shortened and internally rotated. Skin is warm and dry without rash. Neurologic: Mental status is normal, cranial nerves are intact, moves all extremities equally except for the right  leg..  ED Results / Procedures / Treatments   Labs (all labs ordered are listed, but only abnormal results are displayed) Labs Reviewed - No data to display  EKG None  Radiology DG Hip Unilat W or Wo Pelvis 2-3 Views Right  Result Date: 06/21/2022 CLINICAL DATA:  Hip pain. EXAM: DG HIP (WITH OR WITHOUT PELVIS) 2-3V RIGHT COMPARISON:  April 12, 2021 FINDINGS: A right total hip replacement is seen. There is no evidence of surrounding lucency to suggest the presence of hardware loosening or infection. There is widening of the joint space separating the right acetabular and right femoral prostheses. Inferior medial displacement of the femoral head prosthesis is also noted. There is no evidence of an acute fracture. A chronic deformity is again seen along the right inferior pubic ramus. IMPRESSION: 1. Right total hip replacement with inferior medial dislocation of the right femoral head prosthesis. Electronically Signed   By: Aram Candela M.D.   On: 06/21/2022 22:06    Procedures Procedures  Cardiac monitor shows normal sinus rhythm, per my interpretation.  Medications Ordered in ED Medications  propofol (DIPRIVAN) 10 mg/mL bolus/IV push 39.7 mg (has no administration in time range)    ED Course/ Medical Decision Making/ A&P                           Medical Decision Making Amount and/or Complexity of Data  Reviewed Radiology: ordered.   Spontaneous right prosthetic hip dislocation.  X-rays confirm inferior medial dislocation of the prosthetic right femoral head.  I have independently viewed the images, and agree with the radiologist's interpretation.  I reviewed his old records, and he has ED visits for prosthetic hip dislocation on 04/11/2021, 11/22/2017.  He also has a hospitalization on 12/28/2016 for prosthetic hip dislocation with inability to reduce it in the emergency department.  Plan is for procedural sedation with propofol and attempt at closed reduction.  {Document critical  care time when appropriate:1} {Document review of labs and clinical decision tools ie heart score, Chads2Vasc2 etc:1}  {Document your independent review of radiology images, and any outside records:1} {Document your discussion with family members, caretakers, and with consultants:1} {Document social determinants of health affecting pt's care:1} {Document your decision making why or why not admission, treatments were needed:1} Final Clinical Impression(s) / ED Diagnoses Final diagnoses:  None    Rx / DC Orders ED Discharge Orders     None

## 2022-06-22 NOTE — Discharge Instructions (Signed)
Wear the knee immobilizer at all times until you are seen by the orthopedic doctor.  Since you are no longer going to do, you need to be established with an orthopedic physician in [place] to monitor your prosthetic right hip.

## 2022-06-22 NOTE — ED Notes (Signed)
EDP manually reduced patients's dislocation .

## 2022-11-06 ENCOUNTER — Ambulatory Visit: Payer: Self-pay | Admitting: Nurse Practitioner

## 2022-12-10 ENCOUNTER — Emergency Department (HOSPITAL_COMMUNITY): Payer: Medicaid Other

## 2022-12-10 ENCOUNTER — Emergency Department (HOSPITAL_COMMUNITY)
Admission: EM | Admit: 2022-12-10 | Discharge: 2022-12-10 | Discharge status: Against Medical Advice | Payer: Medicaid Other | Source: Home / Self Care | Attending: Emergency Medicine | Admitting: Emergency Medicine

## 2022-12-10 ENCOUNTER — Encounter (HOSPITAL_COMMUNITY): Payer: Self-pay

## 2022-12-10 ENCOUNTER — Other Ambulatory Visit: Payer: Self-pay

## 2022-12-10 ENCOUNTER — Observation Stay (HOSPITAL_COMMUNITY)
Admission: EM | Admit: 2022-12-10 | Discharge: 2022-12-11 | DRG: 561 | Discharge status: Against Medical Advice | Payer: Medicaid Other | Attending: Internal Medicine | Admitting: Internal Medicine

## 2022-12-10 DIAGNOSIS — Z5329 Procedure and treatment not carried out because of patient's decision for other reasons: Secondary | ICD-10-CM | POA: Diagnosis not present

## 2022-12-10 DIAGNOSIS — J328 Other chronic sinusitis: Secondary | ICD-10-CM | POA: Diagnosis not present

## 2022-12-10 DIAGNOSIS — F32A Depression, unspecified: Secondary | ICD-10-CM | POA: Diagnosis present

## 2022-12-10 DIAGNOSIS — F17210 Nicotine dependence, cigarettes, uncomplicated: Secondary | ICD-10-CM | POA: Diagnosis present

## 2022-12-10 DIAGNOSIS — Z833 Family history of diabetes mellitus: Secondary | ICD-10-CM | POA: Diagnosis not present

## 2022-12-10 DIAGNOSIS — F10129 Alcohol abuse with intoxication, unspecified: Secondary | ICD-10-CM | POA: Diagnosis present

## 2022-12-10 DIAGNOSIS — T84020A Dislocation of internal right hip prosthesis, initial encounter: Principal | ICD-10-CM | POA: Diagnosis present

## 2022-12-10 DIAGNOSIS — F1210 Cannabis abuse, uncomplicated: Secondary | ICD-10-CM | POA: Diagnosis not present

## 2022-12-10 DIAGNOSIS — Z87442 Personal history of urinary calculi: Secondary | ICD-10-CM

## 2022-12-10 DIAGNOSIS — F209 Schizophrenia, unspecified: Secondary | ICD-10-CM | POA: Diagnosis not present

## 2022-12-10 DIAGNOSIS — R5383 Other fatigue: Secondary | ICD-10-CM | POA: Diagnosis present

## 2022-12-10 DIAGNOSIS — Z83438 Family history of other disorder of lipoprotein metabolism and other lipidemia: Secondary | ICD-10-CM | POA: Diagnosis not present

## 2022-12-10 DIAGNOSIS — S73034A Other anterior dislocation of right hip, initial encounter: Secondary | ICD-10-CM

## 2022-12-10 DIAGNOSIS — I10 Essential (primary) hypertension: Secondary | ICD-10-CM | POA: Diagnosis not present

## 2022-12-10 DIAGNOSIS — Y792 Prosthetic and other implants, materials and accessory orthopedic devices associated with adverse incidents: Secondary | ICD-10-CM | POA: Diagnosis present

## 2022-12-10 DIAGNOSIS — S0990XA Unspecified injury of head, initial encounter: Secondary | ICD-10-CM | POA: Insufficient documentation

## 2022-12-10 DIAGNOSIS — R400 Somnolence: Secondary | ICD-10-CM | POA: Diagnosis not present

## 2022-12-10 DIAGNOSIS — B9689 Other specified bacterial agents as the cause of diseases classified elsewhere: Secondary | ICD-10-CM | POA: Diagnosis present

## 2022-12-10 DIAGNOSIS — F1010 Alcohol abuse, uncomplicated: Secondary | ICD-10-CM | POA: Insufficient documentation

## 2022-12-10 DIAGNOSIS — S73006A Unspecified dislocation of unspecified hip, initial encounter: Secondary | ICD-10-CM | POA: Diagnosis present

## 2022-12-10 DIAGNOSIS — X58XXXA Exposure to other specified factors, initial encounter: Secondary | ICD-10-CM | POA: Insufficient documentation

## 2022-12-10 DIAGNOSIS — E876 Hypokalemia: Secondary | ICD-10-CM | POA: Diagnosis not present

## 2022-12-10 DIAGNOSIS — F1910 Other psychoactive substance abuse, uncomplicated: Secondary | ICD-10-CM | POA: Diagnosis present

## 2022-12-10 DIAGNOSIS — J349 Unspecified disorder of nose and nasal sinuses: Secondary | ICD-10-CM

## 2022-12-10 DIAGNOSIS — S73004A Unspecified dislocation of right hip, initial encounter: Secondary | ICD-10-CM

## 2022-12-10 MED ORDER — PROPOFOL 10 MG/ML IV BOLUS
1.0000 mg/kg | Freq: Once | INTRAVENOUS | Status: AC
  Administered 2022-12-10: 40 mg via INTRAVENOUS
  Filled 2022-12-10: qty 20

## 2022-12-10 MED ORDER — DROPERIDOL 2.5 MG/ML IJ SOLN: 2.5000 mg | Freq: Once | INTRAMUSCULAR | Status: DC

## 2022-12-10 MED ORDER — FENTANYL CITRATE PF 50 MCG/ML IJ SOSY
100.0000 ug | PREFILLED_SYRINGE | Freq: Once | INTRAMUSCULAR | Status: AC
  Administered 2022-12-10: 50 ug via INTRAVENOUS
  Filled 2022-12-10: qty 2

## 2022-12-10 MED ORDER — DROPERIDOL 2.5 MG/ML IJ SOLN
2.5000 mg | Freq: Once | INTRAMUSCULAR | Status: DC
  Filled 2022-12-10: qty 2

## 2022-12-10 MED ORDER — MIDAZOLAM HCL 2 MG/2ML IJ SOLN
2.0000 mg | Freq: Once | INTRAMUSCULAR | Status: AC
  Administered 2022-12-10: 2 mg via INTRAVENOUS
  Filled 2022-12-10: qty 2

## 2022-12-10 NOTE — ED Notes (Signed)
Pt signed AMA form.

## 2022-12-10 NOTE — ED Triage Notes (Signed)
Pt BIB [facility] from the side of the road. A bystander saw him laying on the side of the road and called EMS. Pt told bystander not to call EMS. Pt c/o right hip pain. Has hx of right hip replacement. States it is out of place and has been here at least 4 times to get it back in place. Pt refused to let EMS touch it.
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
## 2022-12-10 NOTE — ED Provider Notes (Incomplete)
[place] EMERGENCY DEPARTMENT AT [facility] Provider Note   CSN: [id number] Arrival date & time: 12/10/22  2055     History {Add pertinent medical, surgical, social history, OB history to HPI:1} Chief Complaint  Patient presents with  . Hip Pain         Bradley Mcgrath is a 42 y.o. male.   Hip Pain   This patient is a 42 year old male with a prior history of a right hip arthroplasty, he presents with recurrent dislocation which she states happened 3 days ago.  He is try to stay at home and try to get in and by himself for a couple of days then he came this morning, he appeared to be noncompliant with the treating team's evaluation and request, he was inappropriate with staff and insisted on leaving AGAINST MEDICAL ADVICE.  He comes back in tonight stating that he is still been trying to get it back in but it will not go back in.  He denies any other issues, no numbness or weakness of the leg.    Home Medications Prior to Admission medications   Medication Sig Start Date End Date Taking? Authorizing Provider  gabapentin (NEURONTIN) 100 MG capsule Take 1 capsule (100 mg total) by mouth 3 (three) times daily. 09/07/21 11/06/21  Mesner, Barbara Cower, MD      Allergies    Patient has no known allergies.    Review of Systems   Review of Systems  All other systems reviewed and are negative.   Physical Exam Updated Vital Signs BP 131/80 (BP Location: Right Arm)   Pulse 94   Temp 98.7 F (37.1 C)   Resp 16   SpO2 100%  Physical Exam Vitals and nursing note reviewed.  Constitutional:      General: He is not in acute distress.    Appearance: He is well-developed.  HENT:     Head: Normocephalic and atraumatic.     Mouth/Throat:     Pharynx: No oropharyngeal exudate.  Eyes:     General: No scleral icterus.       Right eye: No discharge.        Left eye: No discharge.     Conjunctiva/sclera: Conjunctivae normal.     Pupils: Pupils are equal, round, and reactive to  light.  Neck:     Thyroid: No thyromegaly.     Vascular: No JVD.  Cardiovascular:     Rate and Rhythm: Normal rate and regular rhythm.     Heart sounds: Normal heart sounds. No murmur heard.    No friction rub. No gallop.  Pulmonary:     Effort: Pulmonary effort is normal. No respiratory distress.     Breath sounds: Normal breath sounds. No wheezing or rales.  Abdominal:     General: Bowel sounds are normal. There is no distension.     Palpations: Abdomen is soft. There is no mass.     Tenderness: There is no abdominal tenderness.  Musculoskeletal:        General: Tenderness and deformity present. No swelling.     Cervical back: Normal range of motion and neck supple.     Right lower leg: No edema.     Left lower leg: No edema.     Comments: No edema of the legs, the right lower extremity is shortened and rotated but he is able to flex and extend to some degree.  Lymphadenopathy:     Cervical: No cervical adenopathy.  Skin:  General: Skin is warm and dry.     Findings: No erythema or rash.  Neurological:     Mental Status: He is alert.     Coordination: Coordination normal.  Psychiatric:        Behavior: Behavior normal.     ED Results / Procedures / Treatments   Labs (all labs ordered are listed, but only abnormal results are displayed) Labs Reviewed - No data to display  EKG None  Radiology CT HEAD WO CONTRAST ( )  Result Date: 12/10/2022 CLINICAL DATA:  Provided history: Mental status change, unknown cause. EXAM: CT HEAD WITHOUT CONTRAST TECHNIQUE: Contiguous axial images were obtained from the base of the skull through the vertex without intravenous contrast. RADIATION DOSE REDUCTION: This exam was performed according to the departmental dose-optimization program which includes automated exposure control, adjustment of the mA and/or kV according to patient size and/or use of iterative reconstruction technique. COMPARISON:  Prior head CT examinations 11/01/2020 and  earlier. FINDINGS: Brain: No age advanced or lobar predominant parenchymal atrophy. There is no acute intracranial hemorrhage. No demarcated cortical infarct. No extra-axial fluid collection. No evidence of an intracranial mass. No midline shift. Vascular: No hyperdense vessel. Skull: No fracture or aggressive osseous lesion. Sinuses/Orbits: No mass or acute finding within the imaged orbits. Mild-to-moderate mucosal thickening within the right frontal sinus. Minimal mucosal thickening within the left frontal sinus. Mucosal thickening and fluid within bilateral ethmoid air cells, overall mild-to-moderate in severity. Mild mucosal thickening within the right greater than left sphenoid sinuses. Trace mucosal thickening within the right maxillary sinus. IMPRESSION: 1.  No evidence of an acute intracranial abnormality. 2. Paranasal sinus disease, as described. Electronically Signed   By: Jackey Loge D.O.   On: 12/10/2022 14:04   DG Hip Unilat W or Wo Pelvis 2-3 Views Right  Result Date: 12/10/2022 CLINICAL DATA:  Hip pain EXAM: DG HIP (WITH OR WITHOUT PELVIS) 3V RIGHT COMPARISON:  X-ray 06/22/2022 FINDINGS: There is a dislocated right hip arthroplasty. The femoral head component is dorsal to the acetabular cup and there is overlapping. Screw fixated acetabular cup. No separate fracture or dislocation. Chronic appearing deformity of the right inferior pubic ramus. Preserved joint spaces of the left hip and sacroiliac joints. IMPRESSION: Dislocation of the right hip arthroplasty. Femoral component is dorsal to the acetabular cup and there is foreshortening Electronically Signed   By: Karen Kays M.D.   On: 12/10/2022 12:22    Procedures Procedures  {Document cardiac monitor, telemetry assessment procedure when appropriate:1}  Medications Ordered in ED Medications - No data to display  ED Course/ Medical Decision Making/ A&P   {   Click here for ABCD2, HEART and other calculatorsREFRESH Note before signing  :1}                          Medical Decision Making Amount and/or Complexity of Data Reviewed Radiology: ordered.  Risk Prescription drug management. Decision regarding hospitalization.   Based on the imaging from earlier in the day it is clear that the patient has what looks like a dislocation of the hip.  At this time I would recommend that the patient needs to have this reduced although he has been out for several days making this much less successful given the likely muscle contractures that are involved.  The patient is agreeable to procedural sedation as needed  Imaging repeated and shows persistent dislocation of the right hip.  With the assistance of my physician  assistant and the nurse with the respiratory therapist attempt was made to reduce hip however unsuccessful.  The patient had only 50 mcg of fentanyl, 2 mg of Versed and then small aliquots of propofol and with each dose the patient had significant difficulty breathing.  He required bag-valve-mask ventilation and despite multiple attempts at reduction this was unsuccessful.  Orthopedics paged at 10:45 PM D/w Dr. Jena Gauss - agrees that with pt hx of Substance abuse opiate use / substance abuse - request hospitalist admit.  He requests NPO after midnight - hospitalist to admit    {Document critical care time when appropriate:1} {Document review of labs and clinical decision tools ie heart score, Chads2Vasc2 etc:1}  {Document your independent review of radiology images, and any outside records:1} {Document your discussion with family members, caretakers, and with consultants:1} {Document social determinants of health affecting pt's care:1} {Document your decision making why or why not admission, treatments were needed:1} Final Clinical Impression(s) / ED Diagnoses Final diagnoses:  None    Rx / DC Orders ED Discharge Orders     None

## 2022-12-10 NOTE — ED Notes (Signed)
Attempted to give pt ordered droperidol, pt refused. PA Geiple notified.
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
## 2022-12-10 NOTE — ED Triage Notes (Signed)
Patient c/o right hip pain.   States "right hip is out of place for 3 days."   PMHX: right hip replacement.  Pain 8/10

## 2022-12-10 NOTE — ED Provider Notes (Signed)
[name] EMERGENCY DEPARTMENT AT [facility] Provider Note   CSN: [id number] Arrival date & time: 12/10/22  2055     History {Add pertinent medical, surgical, social history, OB history to HPI:1} Chief Complaint  Patient presents with   Hip Pain         Bradley Mcgrath is a 42 y.o. male.   Hip Pain   This patient is a 42 year old male with a prior history of a right hip arthroplasty, he presents with recurrent dislocation which she states happened 3 days ago.  He is try to stay at home and try to get in and by himself for a couple of days then he came this morning, he appeared to be noncompliant with the treating team's evaluation and request, he was inappropriate with staff and insisted on leaving AGAINST MEDICAL ADVICE.  He comes back in tonight stating that he is still been trying to get it back in but it will not go back in.  He denies any other issues, no numbness or weakness of the leg.    Home Medications Prior to Admission medications   Medication Sig Start Date End Date Taking? Authorizing Provider  gabapentin (NEURONTIN) 100 MG capsule Take 1 capsule (100 mg total) by mouth 3 (three) times daily. 09/07/21 11/06/21  Mesner, Barbara Cower, MD      Allergies    Patient has no known allergies.    Review of Systems   Review of Systems  All other systems reviewed and are negative.   Physical Exam Updated Vital Signs BP 131/80 (BP Location: Right Arm)   Pulse 94   Temp 98.7 F (37.1 C)   Resp 16   SpO2 100%  Physical Exam Vitals and nursing note reviewed.  Constitutional:      General: He is not in acute distress.    Appearance: He is well-developed.  HENT:     Head: Normocephalic and atraumatic.     Mouth/Throat:     Pharynx: No oropharyngeal exudate.  Eyes:     General: No scleral icterus.       Right eye: No discharge.        Left eye: No discharge.     Conjunctiva/sclera: Conjunctivae normal.     Pupils: Pupils are equal, round, and reactive to  light.  Neck:     Thyroid: No thyromegaly.     Vascular: No JVD.  Cardiovascular:     Rate and Rhythm: Normal rate and regular rhythm.     Heart sounds: Normal heart sounds. No murmur heard.    No friction rub. No gallop.  Pulmonary:     Effort: Pulmonary effort is normal. No respiratory distress.     Breath sounds: Normal breath sounds. No wheezing or rales.  Abdominal:     General: Bowel sounds are normal. There is no distension.     Palpations: Abdomen is soft. There is no mass.     Tenderness: There is no abdominal tenderness.  Musculoskeletal:        General: Tenderness and deformity present. No swelling.     Cervical back: Normal range of motion and neck supple.     Right lower leg: No edema.     Left lower leg: No edema.     Comments: No edema of the legs, the right lower extremity is shortened and rotated but he is able to flex and extend to some degree.  Lymphadenopathy:     Cervical: No cervical adenopathy.  Skin:  General: Skin is warm and dry.     Findings: No erythema or rash.  Neurological:     Mental Status: He is alert.     Coordination: Coordination normal.  Psychiatric:        Behavior: Behavior normal.     ED Results / Procedures / Treatments   Labs (all labs ordered are listed, but only abnormal results are displayed) Labs Reviewed - No data to display  EKG None  Radiology CT HEAD WO CONTRAST ( )  Result Date: 12/10/2022 CLINICAL DATA:  Provided history: Mental status change, unknown cause. EXAM: CT HEAD WITHOUT CONTRAST TECHNIQUE: Contiguous axial images were obtained from the base of the skull through the vertex without intravenous contrast. RADIATION DOSE REDUCTION: This exam was performed according to the departmental dose-optimization program which includes automated exposure control, adjustment of the mA and/or kV according to patient size and/or use of iterative reconstruction technique. COMPARISON:  Prior head CT examinations 11/01/2020 and  earlier. FINDINGS: Brain: No age advanced or lobar predominant parenchymal atrophy. There is no acute intracranial hemorrhage. No demarcated cortical infarct. No extra-axial fluid collection. No evidence of an intracranial mass. No midline shift. Vascular: No hyperdense vessel. Skull: No fracture or aggressive osseous lesion. Sinuses/Orbits: No mass or acute finding within the imaged orbits. Mild-to-moderate mucosal thickening within the right frontal sinus. Minimal mucosal thickening within the left frontal sinus. Mucosal thickening and fluid within bilateral ethmoid air cells, overall mild-to-moderate in severity. Mild mucosal thickening within the right greater than left sphenoid sinuses. Trace mucosal thickening within the right maxillary sinus. IMPRESSION: 1.  No evidence of an acute intracranial abnormality. 2. Paranasal sinus disease, as described. Electronically Signed   By: Jackey Loge D.O.   On: 12/10/2022 14:04   DG Hip Unilat W or Wo Pelvis 2-3 Views Right  Result Date: 12/10/2022 CLINICAL DATA:  Hip pain EXAM: DG HIP (WITH OR WITHOUT PELVIS) 3V RIGHT COMPARISON:  X-ray 06/22/2022 FINDINGS: There is a dislocated right hip arthroplasty. The femoral head component is dorsal to the acetabular cup and there is overlapping. Screw fixated acetabular cup. No separate fracture or dislocation. Chronic appearing deformity of the right inferior pubic ramus. Preserved joint spaces of the left hip and sacroiliac joints. IMPRESSION: Dislocation of the right hip arthroplasty. Femoral component is dorsal to the acetabular cup and there is foreshortening Electronically Signed   By: Karen Kays M.D.   On: 12/10/2022 12:22    Procedures Procedures  {Document cardiac monitor, telemetry assessment procedure when appropriate:1}  Medications Ordered in ED Medications - No data to display  ED Course/ Medical Decision Making/ A&P   {   Click here for ABCD2, HEART and other calculatorsREFRESH Note before signing  :1}                          Medical Decision Making Amount and/or Complexity of Data Reviewed Radiology: ordered.  Risk Prescription drug management.   Based on the imaging from earlier in the day it is clear that the patient has what looks like a dislocation of the hip.  At this time I would recommend that the patient needs to have this reduced although he has been out for several days making this much less successful given the likely muscle contractures that are involved.  The patient is agreeable to procedural sedation as needed  Imaging repeated and shows persistent dislocation of the right hip.  With the assistance of my physician assistant and the  nurse with the respiratory therapist attempt was made to reduce hip however unsuccessful.  The patient had only 50 mcg of fentanyl, 2 mg of Versed and then small aliquots of propofol and with each dose the patient had significant difficulty breathing.  He required bag-valve-mask ventilation and despite multiple attempts at reduction this was unsuccessful.  Orthopedics paged at 10:45 PM  {Document critical care time when appropriate:1} {Document review of labs and clinical decision tools ie heart score, Chads2Vasc2 etc:1}  {Document your independent review of radiology images, and any outside records:1} {Document your discussion with family members, caretakers, and with consultants:1} {Document social determinants of health affecting pt's care:1} {Document your decision making why or why not admission, treatments were needed:1} Final Clinical Impression(s) / ED Diagnoses Final diagnoses:  None    Rx / DC Orders ED Discharge Orders     None

## 2022-12-10 NOTE — ED Provider Triage Note (Cosign Needed)
Emergency Medicine Provider Triage Evaluation Note  Bradley Mcgrath , a 42 y.o. male  was evaluated in triage.  Pt complains of hip dislocation, seen earlier for this and refused reduction at that time, stated he is going to come back tonight, he is asking for sandwich now as he states he is very hungry. He denies interval injury, no numbness or tingling in the right leg, he states he is have a dislocation of the past and this feels similar Review of Systems  Positive: Right hip pain Negative: Numbness, tingling, fever  Physical Exam  BP 131/80 (BP Location: Right Arm)   Pulse 94   Temp 98.7 F (37.1 C)   Resp 16   SpO2 100%  Gen:   Awake, no distress   Resp:  Normal effort  MSK:   Moves extremities without difficulty  Other:  Normal sensation of pulse on right foot  Medical Decision Making  Medically screening exam initiated at 9:13 PM.  Appropriate orders placed.  CLIFFORD BENNINGER was informed that the remainder of the evaluation will be completed by another provider, this initial triage assessment does not replace that evaluation, and the importance of remaining in the ED until their evaluation is complete.     Ma Rings, [place] 12/10/22 2115

## 2022-12-10 NOTE — ED Notes (Signed)
This RN attempted to insert IV and ordered labs. Pt refused to let me stick him. Pt states his blood is sacred and he can not let me stick him. PA Geiple notified

## 2022-12-10 NOTE — ED Notes (Signed)
ED Provider at bedside. 

## 2022-12-11 ENCOUNTER — Other Ambulatory Visit: Payer: Self-pay

## 2022-12-11 ENCOUNTER — Encounter (HOSPITAL_COMMUNITY): Payer: Self-pay | Admitting: Emergency Medicine

## 2022-12-11 ENCOUNTER — Emergency Department (HOSPITAL_COMMUNITY)
Admission: EM | Admit: 2022-12-11 | Discharge: 2022-12-11 | Discharge status: Against Medical Advice | Payer: Medicaid Other | Attending: Emergency Medicine | Admitting: Emergency Medicine

## 2022-12-11 DIAGNOSIS — T84020A Dislocation of internal right hip prosthesis, initial encounter: Secondary | ICD-10-CM | POA: Insufficient documentation

## 2022-12-11 DIAGNOSIS — X58XXXA Exposure to other specified factors, initial encounter: Secondary | ICD-10-CM | POA: Diagnosis not present

## 2022-12-11 DIAGNOSIS — S73006A Unspecified dislocation of unspecified hip, initial encounter: Secondary | ICD-10-CM | POA: Diagnosis present

## 2022-12-11 DIAGNOSIS — S73004A Unspecified dislocation of right hip, initial encounter: Secondary | ICD-10-CM | POA: Diagnosis not present

## 2022-12-11 DIAGNOSIS — Z5329 Procedure and treatment not carried out because of patient's decision for other reasons: Secondary | ICD-10-CM | POA: Insufficient documentation

## 2022-12-11 DIAGNOSIS — J349 Unspecified disorder of nose and nasal sinuses: Secondary | ICD-10-CM

## 2022-12-11 DIAGNOSIS — S79911A Unspecified injury of right hip, initial encounter: Secondary | ICD-10-CM | POA: Diagnosis present

## 2022-12-11 LAB — CBC WITH DIFFERENTIAL/PLATELET
Abs Immature Granulocytes: 0.03 10*3/uL (ref 0.00–0.07)
Basophils Absolute: 0 10*3/uL (ref 0.0–0.1)
Basophils Relative: 0 %
Eosinophils Absolute: 0.1 10*3/uL (ref 0.0–0.5)
Eosinophils Relative: 2 %
HCT: 34 % — ABNORMAL LOW (ref 39.0–52.0)
Hemoglobin: 12 g/dL — ABNORMAL LOW (ref 13.0–17.0)
Immature Granulocytes: 0 %
Lymphocytes Relative: 23 %
Lymphs Abs: 1.7 10*3/uL (ref 0.7–4.0)
MCH: 30.8 pg (ref 26.0–34.0)
MCHC: 35.3 g/dL (ref 30.0–36.0)
MCV: 87.2 fL (ref 80.0–100.0)
Monocytes Absolute: 0.8 10*3/uL (ref 0.1–1.0)
Monocytes Relative: 11 %
Neutro Abs: 4.6 10*3/uL (ref 1.7–7.7)
Neutrophils Relative %: 64 %
Platelets: 177 10*3/uL (ref 150–400)
RBC: 3.9 MIL/uL — ABNORMAL LOW (ref 4.22–5.81)
RDW: 12.6 % (ref 11.5–15.5)
WBC: 7.2 10*3/uL (ref 4.0–10.5)
nRBC: 0 % (ref 0.0–0.2)

## 2022-12-11 LAB — BASIC METABOLIC PANEL
Anion gap: 10 (ref 5–15)
BUN: 12 mg/dL (ref 6–20)
CO2: 24 mmol/L (ref 22–32)
Calcium: 8.1 mg/dL — ABNORMAL LOW (ref 8.9–10.3)
Chloride: 103 mmol/L (ref 98–111)
Creatinine, Ser: 0.68 mg/dL (ref 0.61–1.24)
GFR, Estimated: >60 mL/min (ref >60)
Glucose, Bld: 96 mg/dL (ref 70–99)
Potassium: 2.9 mmol/L — ABNORMAL LOW (ref 3.5–5.1)
Sodium: 137 mmol/L (ref 135–145)

## 2022-12-11 LAB — RAPID URINE DRUG SCREEN, HOSP PERFORMED
Amphetamines: POSITIVE — AB
Barbiturates: NOT DETECTED
Benzodiazepines: POSITIVE — AB
Cocaine: NOT DETECTED
Opiates: POSITIVE — AB
Tetrahydrocannabinol: POSITIVE — AB

## 2022-12-11 LAB — MAGNESIUM: Magnesium: 2 mg/dL (ref 1.7–2.4)

## 2022-12-11 MED ORDER — THIAMINE HCL 100 MG/ML IJ SOLN: 100.0000 mg | Freq: Every day | INTRAMUSCULAR | Status: DC

## 2022-12-11 MED ORDER — SODIUM CHLORIDE 0.9 % IV SOLN
1000.0000 mL | INTRAVENOUS | Status: DC
  Administered 2022-12-11: 1000 mL via INTRAVENOUS

## 2022-12-11 MED ORDER — OXYCODONE-ACETAMINOPHEN 5-325 MG PO TABS
1.0000 | ORAL_TABLET | Freq: Once | ORAL | Status: AC
  Administered 2022-12-11: 1 via ORAL
  Filled 2022-12-11: qty 1

## 2022-12-11 MED ORDER — POTASSIUM CHLORIDE 10 MEQ/100ML IV SOLN
10.0000 meq | INTRAVENOUS | Status: AC
  Administered 2022-12-11 (×4): 10 meq via INTRAVENOUS
  Filled 2022-12-11 (×3): qty 100

## 2022-12-11 MED ORDER — ADULT MULTIVITAMIN W/MINERALS CH: 1.0000 | ORAL_TABLET | Freq: Every day | ORAL | Status: DC

## 2022-12-11 MED ORDER — POTASSIUM CHLORIDE 10 MEQ/100ML IV SOLN
10.0000 meq | INTRAVENOUS | Status: AC
  Administered 2022-12-11 (×2): 10 meq via INTRAVENOUS
  Filled 2022-12-11 (×2): qty 100

## 2022-12-11 MED ORDER — METHOCARBAMOL 1000 MG/10ML IJ SOLN: 500.0000 mg | Freq: Four times a day (QID) | INTRAVENOUS | Status: DC | PRN

## 2022-12-11 MED ORDER — FOLIC ACID 1 MG PO TABS: 1.0000 mg | ORAL_TABLET | Freq: Every day | ORAL | Status: DC

## 2022-12-11 MED ORDER — THIAMINE MONONITRATE 100 MG PO TABS: 100.0000 mg | ORAL_TABLET | Freq: Every day | ORAL | Status: DC

## 2022-12-11 MED ORDER — MORPHINE SULFATE (PF) 2 MG/ML IV SOLN
1.0000 mg | INTRAVENOUS | Status: DC | PRN
  Administered 2022-12-11 (×2): 1 mg via INTRAVENOUS
  Filled 2022-12-11 (×2): qty 1

## 2022-12-11 MED ORDER — METHOCARBAMOL 500 MG PO TABS: 500.0000 mg | ORAL_TABLET | Freq: Four times a day (QID) | ORAL | Status: DC | PRN

## 2022-12-11 MED ORDER — NALOXONE HCL 0.4 MG/ML IJ SOLN: 0.4000 mg | INTRAMUSCULAR | Status: DC | PRN

## 2022-12-11 MED ORDER — SODIUM CHLORIDE 0.9 % IV BOLUS (SEPSIS)
1000.0000 mL | Freq: Once | INTRAVENOUS | Status: AC
  Administered 2022-12-11: 1000 mL via INTRAVENOUS

## 2022-12-11 MED ORDER — LORAZEPAM 2 MG/ML IJ SOLN: 1.0000 mg | INTRAMUSCULAR | Status: DC | PRN

## 2022-12-11 MED ORDER — LORAZEPAM 1 MG PO TABS: 1.0000 mg | ORAL_TABLET | ORAL | Status: DC | PRN
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
## 2022-12-11 NOTE — ED Notes (Addendum)
Patient A&O, no s/s of any distress. Patient took off all cord, and put on clothes. Patient also took out IV. Patient refused to sign AMA paperwork , Stating "Im leaving now, and not signing that." Patient walked out on crutches with belonging in hand.MD made aware

## 2022-12-11 NOTE — ED Provider Notes (Signed)
I assumed care of this patient.  Please see previous provider's note for further details of history, exam, and MDM.   Briefly patient is a 42 y.o. male who presented with right hip pain dislocation. Attempt to reduce in the ER was not successful. Dr. Hyacinth Meeker spoke with Dr. Jena Gauss who will arrange for OR reduction. Requested medicine admission given h/o polysubstance use.  Labs ordered. Notable for hypoK+. IV repletion. Consulted medicine for admission      Nira Conn, MD 12/11/22 [phone]
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
## 2022-12-11 NOTE — ED Notes (Signed)
Pt has eaten a bag lunch as well as drank a full soda and full cup of water. Pt given bus pass and has signed AMA form.
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
## 2022-12-11 NOTE — ED Provider Notes (Signed)
[facility] AT [facility] Provider Note   CSN: [id number] Arrival date & time: 12/11/22  1805     History  Chief Complaint  Patient presents with   Hip Pain    Bradley Mcgrath is a 42 y.o. male here presenting with persistent right hip pain.  Patient has signed out AMA multiple times for similar reasons.  He dislocated his hip several days ago.  Patient has been refusing blood work because his blood is sacred.  He was admitted to the hospital yesterday after unsuccessful reduction attempt.  Patient apparently signed out AGAINST MEDICAL ADVICE before he had a or reduction.  He is back here because he has persistent right hip pain.  He also continues to refuse blood work.  The history is provided by the patient.       Home Medications Prior to Admission medications   Medication Sig Start Date End Date Taking? Authorizing Provider  Buprenorphine HCl-Naloxone HCl 8-2 MG FILM Place 3 Film under the tongue daily. 12/05/22   [provider]  naloxone [facility]) nasal spray 4 mg/0.1 mL Place 1 spray into the nose once. 12/06/22   [provider]      Allergies    Patient has no known allergies.    Review of Systems   Review of Systems  Musculoskeletal:        Right hip pain  All other systems reviewed and are negative.   Physical Exam Updated Vital Signs BP 127/74 (BP Location: Left Arm)   Pulse 87   Temp 99.1 F (37.3 C)   Resp 18   Ht 6' (1.829 m)   Wt 78.5 kg   SpO2 99%   BMI 23.46 kg/m  Physical Exam Vitals and nursing note reviewed.  Constitutional:      Comments: Disheveled  HENT:     Head: Normocephalic.     Nose: Nose normal.     Mouth/Throat:     Mouth: Mucous membranes are moist.  Eyes:     Extraocular Movements: Extraocular movements intact.     Pupils: Pupils are equal, round, and reactive to light.  Cardiovascular:     Rate and Rhythm: Normal rate.     Pulses: Normal pulses.  Pulmonary:     Effort:  Pulmonary effort is normal.  Abdominal:     General: Abdomen is flat.     Palpations: Abdomen is soft.  Musculoskeletal:     Cervical back: Normal range of motion.     Comments: Right hip obviously dislocated.  Shortened compared to the left  Skin:    General: Skin is warm.     Capillary Refill: Capillary refill takes less than 2 seconds.  Neurological:     General: No focal deficit present.  Psychiatric:        Mood and Affect: Mood normal.     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 No acute displaced fracture identified. There is no evidence of arthropathy or other focal bone abnormality.  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 Electronically Signed   By: Tish Frederickson M.D.   On: 12/10/2022 22:31   CT HEAD WO CONTRAST ( )  Result Date: 12/10/2022 CLINICAL DATA:  Provided history: Mental status change, unknown cause. EXAM: CT HEAD WITHOUT CONTRAST TECHNIQUE: Contiguous axial images were obtained from the base of the skull through the vertex without intravenous contrast. RADIATION DOSE REDUCTION: This exam was performed according to the departmental dose-optimization program which includes automated exposure control, adjustment of the mA and/or kV according to patient size and/or use of iterative reconstruction technique. COMPARISON:  Prior head CT examinations 11/01/2020 and earlier.  FINDINGS: Brain: No age advanced or lobar predominant parenchymal atrophy. There is no acute intracranial hemorrhage. No demarcated cortical infarct. No extra-axial fluid collection. No evidence of an intracranial mass. No midline shift. Vascular: No hyperdense vessel. Skull: No fracture or aggressive osseous lesion. Sinuses/Orbits: No mass or acute finding within the imaged orbits. Mild-to-moderate mucosal thickening within the right frontal sinus. Minimal mucosal thickening within the left frontal sinus. Mucosal thickening and fluid within bilateral ethmoid air cells, overall mild-to-moderate in severity. Mild mucosal thickening within the right greater than left sphenoid sinuses. Trace mucosal thickening within the right maxillary sinus. IMPRESSION: 1.  No evidence of an acute intracranial abnormality. 2. Paranasal sinus disease, as described. Electronically Signed   By: Jackey Loge D.O.   On: 12/10/2022 14:04   DG Hip Unilat W or Wo Pelvis 2-3 Views Right  Result Date: 12/10/2022 CLINICAL DATA:  Hip pain EXAM: DG HIP (WITH OR WITHOUT PELVIS) 3V RIGHT COMPARISON:  X-ray 06/22/2022 FINDINGS: There is a dislocated right hip arthroplasty. The femoral head component is dorsal to the acetabular cup and there is overlapping. Screw fixated acetabular cup. No separate fracture or dislocation. Chronic appearing deformity of the right inferior pubic ramus. Preserved joint spaces of the left hip and sacroiliac joints. IMPRESSION: Dislocation of the right hip arthroplasty. Femoral component is dorsal to the acetabular cup and there is foreshortening Electronically Signed   By: Karen Kays M.D.   On: 12/10/2022 12:22    Procedures Procedures    Medications Ordered in ED Medications  oxyCODONE-acetaminophen (PERCOCET/ROXICET) 5-325 MG per tablet 1 tablet (has no administration in time range)    ED Course/ Medical Decision Making/ A&P                             Medical Decision Making Bradley Mcgrath is a 42  y.o. male here presenting with right hip dislocation.  This is a recurrent problem and patient continues to refuse care.  He states that he wants to sign AMA paperwork.  I am not clear why he came back and continues to refuse care.  Ordered 1 dose of Percocet.  He has crutches at bedside.  Told to follow-up with Ortho   Problems Addressed: Dislocation of right hip, initial encounter [facility]): acute illness or injury  Risk Prescription drug management.    Final Clinical Impression(s) / ED Diagnoses Final diagnoses:  Dislocation of right hip, initial encounter [facility])    Rx / DC Orders ED Discharge Orders     None         Charlynne Pander, MD 12/11/22 2120

## 2022-12-11 NOTE — ED Notes (Signed)
Pt refusing to have morning labs drawn by phlebotomy, this RN witnessed refusal.

## 2022-12-11 NOTE — Discharge Instructions (Signed)
You have a hip dislocation and signed out AMA because you refused blood work.    Please call Dr. Jena Gauss office for follow-up  Return to ER if you have persistent pain, unable to walk

## 2022-12-11 NOTE — ED Notes (Signed)
Patient refused for my to take his blood, he did give a urine sample. He is talking to himself more. Took all his cords off. Patient stating "If you take my blood I'm leaving." MD made aware

## 2022-12-11 NOTE — ED Triage Notes (Signed)
Pt via POV c/o right hip pain and says he was sitting down several days ago and his hip popped out of joint. He has been here a few times for the same complaint but left AMA because he refused some care. He clarifies stating that ht is "of [place] blood[place]" and we cannot take his blood because "the government will switch out staff members with people who will sell his drops of blood for $300 each on the black market." He consents to x-ray.

## 2022-12-17 ENCOUNTER — Emergency Department (HOSPITAL_COMMUNITY): Payer: Medicaid Other

## 2022-12-17 ENCOUNTER — Emergency Department (HOSPITAL_COMMUNITY): Payer: Medicaid Other | Admitting: Anesthesiology

## 2022-12-17 ENCOUNTER — Encounter (HOSPITAL_COMMUNITY)
Admission: EM | Disposition: A | Discharge status: Home / Self Care | Payer: Self-pay | Source: Home / Self Care | Attending: Internal Medicine

## 2022-12-17 ENCOUNTER — Inpatient Hospital Stay (HOSPITAL_COMMUNITY)
Admission: EM | Admit: 2022-12-17 | Discharge: 2022-12-20 | DRG: 560 | Disposition: A | Discharge status: Home / Self Care | Payer: Medicaid Other | Attending: Internal Medicine | Admitting: Internal Medicine

## 2022-12-17 DIAGNOSIS — F17200 Nicotine dependence, unspecified, uncomplicated: Secondary | ICD-10-CM | POA: Diagnosis present

## 2022-12-17 DIAGNOSIS — Z79899 Other long term (current) drug therapy: Secondary | ICD-10-CM

## 2022-12-17 DIAGNOSIS — Z5982 Transportation insecurity: Secondary | ICD-10-CM

## 2022-12-17 DIAGNOSIS — Z9889 Other specified postprocedural states: Secondary | ICD-10-CM

## 2022-12-17 DIAGNOSIS — T84020A Dislocation of internal right hip prosthesis, initial encounter: Principal | ICD-10-CM | POA: Diagnosis present

## 2022-12-17 DIAGNOSIS — Z888 Allergy status to other drugs, medicaments and biological substances status: Secondary | ICD-10-CM

## 2022-12-17 DIAGNOSIS — E039 Hypothyroidism, unspecified: Secondary | ICD-10-CM | POA: Diagnosis present

## 2022-12-17 DIAGNOSIS — F319 Bipolar disorder, unspecified: Secondary | ICD-10-CM | POA: Diagnosis present

## 2022-12-17 DIAGNOSIS — Z5901 Sheltered homelessness: Secondary | ICD-10-CM

## 2022-12-17 DIAGNOSIS — Y792 Prosthetic and other implants, materials and accessory orthopedic devices associated with adverse incidents: Secondary | ICD-10-CM | POA: Diagnosis present

## 2022-12-17 DIAGNOSIS — F17210 Nicotine dependence, cigarettes, uncomplicated: Secondary | ICD-10-CM | POA: Diagnosis present

## 2022-12-17 DIAGNOSIS — Z91199 Patient's noncompliance with other medical treatment and regimen due to unspecified reason: Secondary | ICD-10-CM

## 2022-12-17 DIAGNOSIS — F200 Paranoid schizophrenia: Secondary | ICD-10-CM | POA: Diagnosis present

## 2022-12-17 DIAGNOSIS — Z5941 Food insecurity: Secondary | ICD-10-CM

## 2022-12-17 DIAGNOSIS — S73014A Posterior dislocation of right hip, initial encounter: Secondary | ICD-10-CM | POA: Insufficient documentation

## 2022-12-17 DIAGNOSIS — F29 Unspecified psychosis not due to a substance or known physiological condition: Secondary | ICD-10-CM | POA: Diagnosis present

## 2022-12-17 DIAGNOSIS — F209 Schizophrenia, unspecified: Secondary | ICD-10-CM | POA: Diagnosis present

## 2022-12-17 DIAGNOSIS — S73006A Unspecified dislocation of unspecified hip, initial encounter: Secondary | ICD-10-CM | POA: Insufficient documentation

## 2022-12-17 DIAGNOSIS — Z8673 Personal history of transient ischemic attack (TIA), and cerebral infarction without residual deficits: Secondary | ICD-10-CM

## 2022-12-17 DIAGNOSIS — S73004A Unspecified dislocation of right hip, initial encounter: Secondary | ICD-10-CM

## 2022-12-17 DIAGNOSIS — Z833 Family history of diabetes mellitus: Secondary | ICD-10-CM

## 2022-12-17 DIAGNOSIS — I10 Essential (primary) hypertension: Secondary | ICD-10-CM | POA: Diagnosis present

## 2022-12-17 DIAGNOSIS — F1990 Other psychoactive substance use, unspecified, uncomplicated: Secondary | ICD-10-CM | POA: Diagnosis present

## 2022-12-17 DIAGNOSIS — Z83438 Family history of other disorder of lipoprotein metabolism and other lipidemia: Secondary | ICD-10-CM

## 2022-12-17 HISTORY — DX: Cerebral infarction, unspecified: I63.9

## 2022-12-17 SURGERY — CLOSED REDUCTION, HIP
Anesthesia: General | Site: Hip | Laterality: Right

## 2022-12-17 MED ORDER — ZIPRASIDONE MESYLATE 20 MG IM SOLR
20.0000 mg | Freq: Two times a day (BID) | INTRAMUSCULAR | Status: DC | PRN
  Administered 2022-12-17: 20 mg via INTRAMUSCULAR
  Filled 2022-12-17: qty 20

## 2022-12-17 MED ORDER — STERILE WATER FOR INJECTION IJ SOLN
INTRAMUSCULAR | Status: AC
  Filled 2022-12-17: qty 10

## 2022-12-17 MED ORDER — LACTATED RINGERS IV BOLUS
1000.0000 mL | Freq: Once | INTRAVENOUS | Status: AC
  Administered 2022-12-17: 1000 mL via INTRAVENOUS

## 2022-12-17 MED ORDER — FENTANYL CITRATE PF 50 MCG/ML IJ SOSY
PREFILLED_SYRINGE | INTRAMUSCULAR | Status: AC
  Filled 2022-12-17: qty 1

## 2022-12-17 MED ORDER — PROPOFOL 10 MG/ML IV BOLUS
0.5000 mg/kg | Freq: Once | INTRAVENOUS | Status: AC
  Administered 2022-12-17: 39.3 mg via INTRAVENOUS
  Filled 2022-12-17: qty 20

## 2022-12-17 MED ORDER — FENTANYL CITRATE (PF) 100 MCG/2ML IJ SOLN
INTRAMUSCULAR | Status: AC | PRN
  Administered 2022-12-17: 50 ug via INTRAVENOUS

## 2022-12-17 MED ORDER — ONDANSETRON HCL 4 MG/2ML IJ SOLN
4.0000 mg | Freq: Once | INTRAMUSCULAR | Status: AC
  Administered 2022-12-17: 4 mg via INTRAVENOUS
  Filled 2022-12-17: qty 2

## 2022-12-17 MED ORDER — FENTANYL CITRATE PF 50 MCG/ML IJ SOSY
50.0000 ug | PREFILLED_SYRINGE | Freq: Once | INTRAMUSCULAR | Status: AC
  Administered 2022-12-17: 50 ug via INTRAVENOUS
  Filled 2022-12-17: qty 1

## 2022-12-17 MED ORDER — LORAZEPAM 2 MG/ML IJ SOLN: 2.0000 mg | Freq: Once | INTRAMUSCULAR | Status: DC

## 2022-12-17 MED ORDER — PROPOFOL 10 MG/ML IV BOLUS
INTRAVENOUS | Status: AC | PRN
  Administered 2022-12-17 (×3): 40 mg via INTRAVENOUS

## 2022-12-17 NOTE — ED Notes (Signed)
IVC paperwork in progress in green zone

## 2022-12-17 NOTE — ED Notes (Signed)
Patient declining medication administration. During conversation patient stated that he is from a [place] and cannot have his blood drawn and that he was told not to have his blood drawn by the [employer].

## 2022-12-17 NOTE — ED Notes (Signed)
Pt given Geodon with security at bedside.  Pt continued to verbally refuse but did not fight this RN while administering medication.  Pt still not agreeable to labs at this time.
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
## 2022-12-17 NOTE — ED Notes (Signed)
Pt appears somewhat more calm at this time.  Resting in bed with eyes closed, occasionally shifting in bed.  This RN will attempt to obtain labs shortly.

## 2022-12-17 NOTE — ED Notes (Signed)
Provider back at bedside with pt.  Pt wanting to leave at this time.  Per provider, pt will likely be IVC'd due to current status.

## 2022-12-17 NOTE — ED Notes (Signed)
Pt brought back to ED 4 from lobby by security.  Staff obtaining pt's belongings and attempting to change pt into purple scrubs with security present.

## 2022-12-17 NOTE — ED Provider Notes (Signed)
[facility] AT [facility] Provider Note   CSN: [id number] Arrival date & time: 12/17/22  1344     History  Chief Complaint  Patient presents with   Hip Pain    TAKESHI PITMAN is a 42 y.o. male.   Hip Pain     42 year old male with medical history significant for hip replacement who presents to the emergency department with concern for right hip dislocation.  The patient thinks that his hip has been dislocated for the past 2 weeks.  He states that he was sitting on the ground when he felt a pop out.  He has been ambulating with the assistance of crutches ever since.  He presents today due to worsening pain.  Home Medications Prior to Admission medications   Medication Sig Start Date End Date Taking? Authorizing Provider  Buprenorphine HCl-Naloxone HCl 8-2 MG FILM Place 3 Film under the tongue daily. 12/05/22   [provider]  naloxone [facility]) nasal spray 4 mg/0.1 mL Place 1 spray into the nose once. 12/06/22   [provider]      Allergies    Patient has no known allergies.    Review of Systems   Review of Systems  All other systems reviewed and are negative.   Physical Exam Updated Vital Signs BP 128/68   Pulse 82   Temp 98.7 F (37.1 C) (Oral)   Resp 19   SpO2 100%  Physical Exam Vitals and nursing note reviewed.  Constitutional:      General: He is not in acute distress. HENT:     Head: Normocephalic and atraumatic.  Eyes:     Conjunctiva/sclera: Conjunctivae normal.     Pupils: Pupils are equal, round, and reactive to light.  Cardiovascular:     Rate and Rhythm: Normal rate and regular rhythm.  Pulmonary:     Effort: Pulmonary effort is normal. No respiratory distress.  Abdominal:     General: There is no distension.     Tenderness: There is no guarding.  Musculoskeletal:        General: No deformity or signs of injury.     Cervical back: Neck supple.     Comments: Right lower extremity shortened,  intact distal pulses  Skin:    Findings: No lesion or rash.  Neurological:     General: No focal deficit present.     Mental Status: He is alert. Mental status is at baseline.     ED Results / Procedures / Treatments   Labs (all labs ordered are listed, but only abnormal results are displayed) Labs Reviewed  CBC WITH DIFFERENTIAL/PLATELET  PROTIME-INR  COMPREHENSIVE METABOLIC PANEL  ETHANOL  RAPID URINE DRUG SCREEN, HOSP PERFORMED  URINALYSIS, ROUTINE W REFLEX MICROSCOPIC  TSH    EKG None  Radiology DG Hip Unilat W or Wo Pelvis 2-3 Views Right  Result Date: 12/17/2022 CLINICAL DATA:  Hip dislocation EXAM: DG HIP (WITH OR WITHOUT PELVIS) 2-3V RIGHT COMPARISON:  Right hip x-ray 12/10/2022 FINDINGS: There is posterosuperior dislocation of the femoral head component in relation to the acetabular cup. No acute fracture or hardware loosening identified. There are likely healed right pubic rami fractures. IMPRESSION: Posterosuperior dislocation of the right hip arthroplasty. Electronically Signed   By: Darliss Cheney M.D.   On: 12/17/2022 16:38    Procedures .Sedation  Date/Time: 12/17/2022 7:04 PM  Performed by: Ernie Avena, MD Authorized by: Ernie Avena, MD   Consent:    Consent obtained:  Written  Consent given by:  Patient   Risks discussed:  Prolonged hypoxia resulting in organ damage, dysrhythmia, allergic reaction, nausea, vomiting, inadequate sedation and prolonged sedation necessitating reversal Universal protocol:    Immediately prior to procedure, a time out was called: yes     Patient identity confirmed:  Verbally with patient and arm band Indications:    Procedure performed:  Dislocation reduction   Procedure necessitating sedation performed by:  Physician performing sedation Pre-sedation assessment:    Time since last food or drink:  4 hrs   ASA classification: class 1 - normal, healthy patient     Mouth opening:  2 finger widths   Thyromental distance:  3  finger widths   Mallampati score:  I - soft palate, uvula, fauces, pillars visible   Neck mobility: normal     Pre-sedation assessments completed and reviewed: airway patency, cardiovascular function, hydration status, mental status, nausea/vomiting, pain level, respiratory function and temperature     Pre-sedation assessment completed:  12/17/2022 6:16 PM Immediate pre-procedure details:    Reassessment: Patient reassessed immediately prior to procedure     Verified: bag valve mask available, emergency equipment available, intubation equipment available, IV patency confirmed, oxygen available, reversal medications available and suction available   Procedure details (see MAR for exact dosages):    Preoxygenation:  Nasal cannula   Sedation:  Propofol   Intended level of sedation: deep   Analgesia:  Fentanyl   Intra-procedure monitoring:  Blood pressure monitoring, continuous capnometry, frequent LOC assessments, frequent vital sign checks, continuous pulse oximetry and cardiac monitor   Intra-procedure events: none     Total Provider sedation time (minutes):  10 Post-procedure details:    Post-sedation assessment completed:  12/17/2022 6:46 PM   Attendance: Constant attendance by certified staff until patient recovered     Post-sedation assessments completed and reviewed: airway patency, cardiovascular function, hydration status, mental status, nausea/vomiting, pain level and respiratory function     Procedure completion:  Tolerated well, no immediate complications .Ortho Injury Treatment  Date/Time: 12/17/2022 7:19 PM  Performed by: Ernie Avena, MD Authorized by: Ernie Avena, MD   Consent:    Consent obtained:  Written and verbal   Consent given by:  Patient   Risks discussed:  Recurrent dislocation   Alternatives discussed:  No treatment and alternative treatmentInjury location: hip Location details: right hip Injury type: dislocation Dislocation type: posterior Spontaneous  dislocation: yes Prosthesis: yes Pre-procedure neurovascular assessment: neurovascularly intact  Patient sedated: Yes. Refer to sedation procedure documentation for details of sedation. Manipulation performed: yes Reduction method: traction and counter traction Reduction successful: no Post-procedure neurovascular assessment: post-procedure neurovascularly intact Comments: Procedure was unsuccessful, hip remains dislocated       Medications Ordered in ED Medications  fentaNYL (SUBLIMAZE) 50 MCG/ML injection (has no administration in time range)  ziprasidone (GEODON) injection 20 mg (20 mg Intramuscular Given 12/17/22 2005)  LORazepam (ATIVAN) injection 2 mg (has no administration in time range)  propofol (DIPRIVAN) 10 mg/mL bolus/IV push 39.3 mg (39.3 mg Intravenous Given 12/17/22 1815)  ondansetron (ZOFRAN) injection 4 mg (4 mg Intravenous Given 12/17/22 1722)  fentaNYL (SUBLIMAZE) injection 50 mcg (50 mcg Intravenous Given 12/17/22 1722)  lactated ringers bolus 1,000 mL (0 mLs Intravenous Stopped 12/17/22 1820)  fentaNYL (SUBLIMAZE) injection (50 mcg Intravenous Given 12/17/22 1820)  propofol (DIPRIVAN) 10 mg/mL bolus/IV push (40 mg Intravenous Given 12/17/22 1818)  sterile water (preservative free) injection (  Given 12/17/22 2023)    ED Course/ Medical Decision Making/ A&P  Medical Decision Making Amount and/or Complexity of Data Reviewed Labs: ordered.  Risk Prescription drug management.    42 year old male with medical history significant for hip replacement who presents to the emergency department with concern for right hip dislocation.  The patient thinks that his hip has been dislocated for the past 2 weeks.  He states that he was sitting on the ground when he felt a pop out.  He has been ambulating with the assistance of crutches ever since.  He presents today due to worsening pain.  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  Physical exam concerning for right lower extremity that was shortened, neurovascularly intact.  Has previously been evaluated and left AMA after unsuccessful reductions.  Has been ambulating with the assistance of crutches but having pain with ambulation.  Has previously required hospitalization and reduction in the OR.  X-ray imaging was performed and confirmed a posterior superior hip dislocation.  The patient was consented for the procedural sedation for reduction of his prosthesis.  Reduction attempts were attempted utilizing propofol and fentanyl and were unsuccessful.  I then consulted orthopedics, discussed with Dr. Charlann Boxer who will attempt reduction in the OR.  I ordered screening labs.  The patient refused lab draw.  He began to act bizarrely.  He stated "I cannot give blood, I am distended from a Royal bloodline."  He stated "the [employer] will not allow me to give blood."  He then became agitated and attempted to leave the emergency department.  He does have a history of schizophrenia.  I asked if the patient was on any medications.  The patient endorsed medication noncompliance.  He endorses auditory hallucinations.  I have concern for decompensated schizophrenia in the setting of medication noncompliance.  The patient attempted to ambulate out the emergency department and IVC paperwork was filed as I do not believe the patient has capacity for medical decision making given his schizophrenia that is untreated.  IM Geodon was administered for agitation.  Will engage with nursing to attempt labs for medical clearance for TTS consultation.  Signout given to Dr.Rees to follow-up results.    Final Clinical Impression(s) / ED Diagnoses Final diagnoses:  Dislocation of right hip, initial encounter (HCC)  Schizophrenia, unspecified type [facility])    Rx / DC Orders ED Discharge Orders     None         Ernie Avena, MD 12/17/22  2257

## 2022-12-17 NOTE — ED Notes (Signed)
IVC paperwork complete and in green zone, copies sent to [facility] and original in red folder, expires 12/24/22

## 2022-12-17 NOTE — ED Notes (Signed)
Pt's belongings taken to [facility] in [place].

## 2022-12-17 NOTE — ED Provider Triage Note (Cosign Needed)
Emergency Medicine Provider Triage Evaluation Note  Bradley Mcgrath , a 42 y.o. male  was evaluated in triage.  Pt complains of right hip pain x 2 weeks. Denies walking a lot recently, notes that he has been resting more. Denies numbness, tingling, urinary symptoms.   Review of Systems  Positive:  Negative:   Physical Exam  BP 115/65 (BP Location: Right Arm)   Pulse 93   Temp 98.7 F (37.1 C) (Oral)   Resp 16   SpO2 100%  Gen:   Awake, no distress   Resp:  Normal effort  MSK:   Moves extremities without difficulty  Other:  Mild TTP noted to right anterior hip with decreased ROM secondary to pain.   Medical Decision Making  Medically screening exam initiated at 2:02 PM.  Appropriate orders placed.  LACARLOS RESPASS was informed that the remainder of the evaluation will be completed by another provider, this initial triage assessment does not replace that evaluation, and the importance of remaining in the ED until their evaluation is complete.  Work-up initiated.    Lelynd Poer A, PA-C 12/17/22 1425

## 2022-12-17 NOTE — BH Assessment (Signed)
Clinician reached out to RN Joselyn Glassman W to inquire on completing TTS assessment. Per RN, patient still drowsy from receiving Geodon. RN will reach out to clinician once patient is able to engage.   Manfred Arch, MSW, LCSW Triage Specialist

## 2022-12-17 NOTE — Sedation Documentation (Signed)
First few attempts were unsuccessful, Lawsing, MD currently taking a moment to allow the patient to rest.

## 2022-12-17 NOTE — ED Notes (Signed)
Pt refusing to change into purple scrubs at this time; wanting to stay in his underwear.

## 2022-12-17 NOTE — ED Notes (Addendum)
Pt refusing labs at this time.  This RN explained purpose for lab orders in relation to OR plans.  Pt states, "I can't give blood, I have a high blood line.  I have this problem every time because they need to draw blood work and I cannot give blood because my blood is from royalty."  Lawsing MD notified of same.

## 2022-12-17 NOTE — ED Notes (Signed)
Pt received for care at 1905.  This RN introduced self to pt.  Bed in lowest position, wheels locked.  Call bell within reach. 

## 2022-12-17 NOTE — ED Triage Notes (Signed)
Pt called EMS because he thinks his R hip has been dislocated for two weeks. No deformity or injury per EMS.

## 2022-12-17 NOTE — ED Notes (Signed)
Pt trying to leave at this time.  Pt ambulating with crutches to lobby.  Staff alerted security of provider's plan to IVC patient.

## 2022-12-18 ENCOUNTER — Other Ambulatory Visit: Payer: Self-pay

## 2022-12-18 ENCOUNTER — Encounter (HOSPITAL_COMMUNITY): Payer: Self-pay | Admitting: Internal Medicine

## 2022-12-18 DIAGNOSIS — F17210 Nicotine dependence, cigarettes, uncomplicated: Secondary | ICD-10-CM | POA: Diagnosis present

## 2022-12-18 DIAGNOSIS — F319 Bipolar disorder, unspecified: Secondary | ICD-10-CM | POA: Diagnosis present

## 2022-12-18 DIAGNOSIS — Y792 Prosthetic and other implants, materials and accessory orthopedic devices associated with adverse incidents: Secondary | ICD-10-CM | POA: Diagnosis present

## 2022-12-18 DIAGNOSIS — Z91199 Patient's noncompliance with other medical treatment and regimen due to unspecified reason: Secondary | ICD-10-CM | POA: Diagnosis not present

## 2022-12-18 DIAGNOSIS — F200 Paranoid schizophrenia: Secondary | ICD-10-CM | POA: Diagnosis present

## 2022-12-18 DIAGNOSIS — S73004A Unspecified dislocation of right hip, initial encounter: Secondary | ICD-10-CM | POA: Diagnosis not present

## 2022-12-18 DIAGNOSIS — F29 Unspecified psychosis not due to a substance or known physiological condition: Secondary | ICD-10-CM | POA: Diagnosis present

## 2022-12-18 DIAGNOSIS — T84020A Dislocation of internal right hip prosthesis, initial encounter: Secondary | ICD-10-CM | POA: Diagnosis present

## 2022-12-18 DIAGNOSIS — I10 Essential (primary) hypertension: Secondary | ICD-10-CM | POA: Diagnosis present

## 2022-12-18 DIAGNOSIS — Z8673 Personal history of transient ischemic attack (TIA), and cerebral infarction without residual deficits: Secondary | ICD-10-CM | POA: Diagnosis not present

## 2022-12-18 DIAGNOSIS — S73014A Posterior dislocation of right hip, initial encounter: Secondary | ICD-10-CM | POA: Diagnosis not present

## 2022-12-18 DIAGNOSIS — Z79899 Other long term (current) drug therapy: Secondary | ICD-10-CM | POA: Diagnosis not present

## 2022-12-18 DIAGNOSIS — Z888 Allergy status to other drugs, medicaments and biological substances status: Secondary | ICD-10-CM | POA: Diagnosis not present

## 2022-12-18 DIAGNOSIS — F209 Schizophrenia, unspecified: Secondary | ICD-10-CM

## 2022-12-18 DIAGNOSIS — Z833 Family history of diabetes mellitus: Secondary | ICD-10-CM | POA: Diagnosis not present

## 2022-12-18 DIAGNOSIS — Z5941 Food insecurity: Secondary | ICD-10-CM | POA: Diagnosis not present

## 2022-12-18 DIAGNOSIS — E039 Hypothyroidism, unspecified: Secondary | ICD-10-CM | POA: Diagnosis present

## 2022-12-18 DIAGNOSIS — Z9889 Other specified postprocedural states: Secondary | ICD-10-CM

## 2022-12-18 DIAGNOSIS — Z83438 Family history of other disorder of lipoprotein metabolism and other lipidemia: Secondary | ICD-10-CM | POA: Diagnosis not present

## 2022-12-18 DIAGNOSIS — Z5982 Transportation insecurity: Secondary | ICD-10-CM | POA: Diagnosis not present

## 2022-12-18 DIAGNOSIS — F1990 Other psychoactive substance use, unspecified, uncomplicated: Secondary | ICD-10-CM | POA: Diagnosis present

## 2022-12-18 DIAGNOSIS — D649 Anemia, unspecified: Secondary | ICD-10-CM | POA: Diagnosis not present

## 2022-12-18 DIAGNOSIS — Z5901 Sheltered homelessness: Secondary | ICD-10-CM | POA: Diagnosis not present

## 2022-12-18 DIAGNOSIS — M25551 Pain in right hip: Secondary | ICD-10-CM | POA: Diagnosis present

## 2022-12-18 LAB — CBC WITH DIFFERENTIAL/PLATELET
Abs Immature Granulocytes: 0.04 10*3/uL (ref 0.00–0.07)
Basophils Absolute: 0 10*3/uL (ref 0.0–0.1)
Basophils Relative: 1 %
Eosinophils Absolute: 0.3 10*3/uL (ref 0.0–0.5)
Eosinophils Relative: 4 %
HCT: 36.1 % — ABNORMAL LOW (ref 39.0–52.0)
Hemoglobin: 12.2 g/dL — ABNORMAL LOW (ref 13.0–17.0)
Immature Granulocytes: 1 %
Lymphocytes Relative: 33 %
Lymphs Abs: 2.1 10*3/uL (ref 0.7–4.0)
MCH: 30.2 pg (ref 26.0–34.0)
MCHC: 33.8 g/dL (ref 30.0–36.0)
MCV: 89.4 fL (ref 80.0–100.0)
Monocytes Absolute: 0.4 10*3/uL (ref 0.1–1.0)
Monocytes Relative: 7 %
Neutro Abs: 3.4 10*3/uL (ref 1.7–7.7)
Neutrophils Relative %: 54 %
Platelets: 290 10*3/uL (ref 150–400)
RBC: 4.04 MIL/uL — ABNORMAL LOW (ref 4.22–5.81)
RDW: 12.3 % (ref 11.5–15.5)
WBC: 6.3 10*3/uL (ref 4.0–10.5)
nRBC: 0 % (ref 0.0–0.2)

## 2022-12-18 LAB — COMPREHENSIVE METABOLIC PANEL
ALT: 23 U/L (ref 0–44)
AST: 19 U/L (ref 15–41)
Albumin: 2.7 g/dL — ABNORMAL LOW (ref 3.5–5.0)
Alkaline Phosphatase: 107 U/L (ref 38–126)
Anion gap: 6 (ref 5–15)
BUN: 8 mg/dL (ref 6–20)
CO2: 26 mmol/L (ref 22–32)
Calcium: 8.5 mg/dL — ABNORMAL LOW (ref 8.9–10.3)
Chloride: 103 mmol/L (ref 98–111)
Creatinine, Ser: 0.66 mg/dL (ref 0.61–1.24)
GFR, Estimated: >60 mL/min (ref >60)
Glucose, Bld: 107 mg/dL — ABNORMAL HIGH (ref 70–99)
Potassium: 3.8 mmol/L (ref 3.5–5.1)
Sodium: 135 mmol/L (ref 135–145)
Total Bilirubin: 0.3 mg/dL (ref 0.3–1.2)
Total Protein: 6.1 g/dL — ABNORMAL LOW (ref 6.5–8.1)

## 2022-12-18 LAB — URINALYSIS, ROUTINE W REFLEX MICROSCOPIC
Bacteria, UA: NONE SEEN
Bilirubin Urine: NEGATIVE
Glucose, UA: NEGATIVE mg/dL
Hgb urine dipstick: NEGATIVE
Ketones, ur: NEGATIVE mg/dL
Leukocytes,Ua: NEGATIVE
Nitrite: NEGATIVE
Protein, ur: NEGATIVE mg/dL
Specific Gravity, Urine: 1.004 — ABNORMAL LOW (ref 1.005–1.030)
pH: 7 (ref 5.0–8.0)

## 2022-12-18 LAB — RAPID URINE DRUG SCREEN, HOSP PERFORMED
Amphetamines: NOT DETECTED
Barbiturates: NOT DETECTED
Benzodiazepines: NOT DETECTED
Cocaine: NOT DETECTED
Opiates: NOT DETECTED
Tetrahydrocannabinol: NOT DETECTED

## 2022-12-18 LAB — ETHANOL: Alcohol, Ethyl (B): <10 mg/dL (ref <10)

## 2022-12-18 LAB — PROTIME-INR
INR: 1.1 (ref 0.8–1.2)
Prothrombin Time: 13.6 seconds (ref 11.4–15.2)

## 2022-12-18 LAB — T4, FREE: Free T4: 1.06 ng/dL (ref 0.61–1.12)

## 2022-12-18 LAB — MRSA NEXT GEN BY PCR, NASAL: MRSA by PCR Next Gen: NOT DETECTED

## 2022-12-18 LAB — TSH: TSH: 6.469 u[IU]/mL — ABNORMAL HIGH (ref 0.350–4.500)

## 2022-12-18 MED ORDER — MORPHINE SULFATE (PF) 2 MG/ML IV SOLN
1.0000 mg | INTRAVENOUS | Status: DC | PRN
  Administered 2022-12-18 – 2022-12-20 (×7): 1 mg via INTRAVENOUS
  Filled 2022-12-18 (×7): qty 1

## 2022-12-18 MED ORDER — GABAPENTIN 400 MG PO CAPS: 800.0000 mg | ORAL_CAPSULE | Freq: Three times a day (TID) | ORAL | Status: DC | PRN

## 2022-12-18 MED ORDER — ACETAMINOPHEN 325 MG PO TABS: 650.0000 mg | ORAL_TABLET | Freq: Four times a day (QID) | ORAL | Status: DC | PRN

## 2022-12-18 MED ORDER — LORAZEPAM 2 MG/ML IJ SOLN: 2.0000 mg | INTRAMUSCULAR | Status: DC | PRN

## 2022-12-18 MED ORDER — ONDANSETRON HCL 4 MG PO TABS: 4.0000 mg | ORAL_TABLET | Freq: Four times a day (QID) | ORAL | Status: DC | PRN

## 2022-12-18 MED ORDER — BUPRENORPHINE HCL-NALOXONE HCL 2-0.5 MG SL SUBL
2.0000 | SUBLINGUAL_TABLET | Freq: Two times a day (BID) | SUBLINGUAL | Status: DC
  Administered 2022-12-18 – 2022-12-20 (×5): 2 via SUBLINGUAL
  Filled 2022-12-18 (×5): qty 2

## 2022-12-18 MED ORDER — ENOXAPARIN SODIUM 40 MG/0.4ML IJ SOSY: 40.0000 mg | PREFILLED_SYRINGE | Freq: Every day | INTRAMUSCULAR | Status: DC

## 2022-12-18 MED ORDER — RISPERIDONE 1 MG PO TBDP
2.0000 mg | ORAL_TABLET | Freq: Every day | ORAL | Status: DC
  Administered 2022-12-18 – 2022-12-19 (×2): 2 mg via ORAL
  Filled 2022-12-18 (×3): qty 2

## 2022-12-18 MED ORDER — ACETAMINOPHEN 650 MG RE SUPP: 650.0000 mg | Freq: Four times a day (QID) | RECTAL | Status: DC | PRN

## 2022-12-18 MED ORDER — OXYCODONE HCL 5 MG PO TABS
5.0000 mg | ORAL_TABLET | ORAL | Status: DC | PRN
  Administered 2022-12-18 – 2022-12-20 (×8): 5 mg via ORAL
  Filled 2022-12-18 (×9): qty 1

## 2022-12-18 MED ORDER — QUETIAPINE FUMARATE 25 MG PO TABS: 25.0000 mg | ORAL_TABLET | Freq: Once | ORAL | Status: DC

## 2022-12-18 MED ORDER — BUPRENORPHINE HCL-NALOXONE HCL 2-0.5 MG SL SUBL
2.0000 | SUBLINGUAL_TABLET | Freq: Every day | SUBLINGUAL | Status: DC
  Administered 2022-12-18: 2 via SUBLINGUAL
  Filled 2022-12-18: qty 2

## 2022-12-18 MED ORDER — NICOTINE 14 MG/24HR TD PT24
14.0000 mg | MEDICATED_PATCH | Freq: Every day | TRANSDERMAL | Status: DC
  Administered 2022-12-19 – 2022-12-20 (×2): 14 mg via TRANSDERMAL
  Filled 2022-12-18 (×2): qty 1

## 2022-12-18 MED ORDER — SENNOSIDES-DOCUSATE SODIUM 8.6-50 MG PO TABS: 1.0000 | ORAL_TABLET | Freq: Every evening | ORAL | Status: DC | PRN

## 2022-12-18 MED ORDER — ONDANSETRON HCL 4 MG/2ML IJ SOLN: 4.0000 mg | Freq: Four times a day (QID) | INTRAMUSCULAR | Status: DC | PRN

## 2022-12-18 NOTE — ED Notes (Signed)
Per PA Leotis Shames, pt will go to er tomorrow and will be NPO after midnight, sandwich and soda given to pt, pt had been requesting rood, but was previously NPO for potential surgery.

## 2022-12-18 NOTE — ED Notes (Signed)
Pt back lying in stretcher with eyes closed at this time.

## 2022-12-18 NOTE — Hospital Course (Addendum)
42 year old male with history of schizophrenia depression, hypertension, kidney stone, on nasal naloxone and Suboxone for, smoking use and history of right hip arthroplasty coming to the ED again with right hip pain. He has had multiple ED visits and has been living AGAINST MEDICAL ADVICE  In the ED vitals stable, labs shows fairly stable CMP CBC with mild anemia TSH is only slightly high free T4 normal INR 1.1 UA unremarkable UDS was negative, alcohol level less than 10.  uds previously on 4/30 was positive for amphetamine benzo opiates and THC In ED unsuccessful attempt was made to reduce the hip.  Patient later noted to have psychosis and aggression given Geodon and Ativan and placed under IVC orthopedic was consulted and admitted under hospitalist service. Psychiatry was consulted. S/p closed reduction of hip dislocation in OR 5/8 Post op doing well Ptot has cleared Awaiting psych clearance

## 2022-12-18 NOTE — Progress Notes (Signed)
Patient seen and examined personally, I reviewed the chart, history and physical and admission note, done by admitting physician this morning and agree with the same with following addendum.  Please refer to the morning admission note for more detailed plan of care.  Briefly, 42 year old male with history of schizophrenia depression, hypertension, kidney stone, on nasal naloxone and Suboxone for, smoking use and history of right hip arthroplasty coming to the ED again with right hip pain. He has had multiple ED visits and has been living AGAINST MEDICAL ADVICE  In the ED vitals stable, labs shows fairly stable CMP CBC with mild anemia TSH is only slightly high free T4 normal INR 1.1 UA unremarkable UDS was negative, alcohol level less than 10.  uds previously on 4/30 was positive for amphetamine benzo opiates and THC In ED unsuccessful attempt was made to reduce the hip.  Patient later noted to have psychosis and aggression given Geodon and Ativan and placed under IVC orthopedic was consulted and admitted under hospitalist service. Psychiatry was consulted.  Patient seen and examined in ED In ED- alert awake oriented ti place people president, Aware abt his medical condition and wants to get it fixed this time. He has understanding of his medical condition. He has been noncompliant No SI/HI. 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 Charlann Boxer following closely and planning to take him to the OR for closed reduction under anesthesia. Stopped lovenox. Continue pain control with IV and oral opiates, PT OT consult once able Per ortho.  History of schizophrenia Paranoid delusion Homelessness On chronic Suboxone: Patient has been placed under IVC on admission.  Psychiatry has been consulted.  No SI and Homicidal ideation. his morning  is alert awake follows commands and wants  to have close reduction of his hip on OR, and remains NPO.  Continue his Suboxone, neurontin, nicotine patch.  Psychiatry input has been appreciated continue IVC, noted plan for inpatient psych admission  Substance abuse history currently UDS negative but uds previously on 4/30 was positive for amphetamine benzo opiates and THC. Toc consult.
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
## 2022-12-18 NOTE — ED Notes (Signed)
IVC paperwork is in orange zone 
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
## 2022-12-18 NOTE — ED Notes (Signed)
Pt in bed, pt offers no complaints, pt requests more food,pt awaits transport

## 2022-12-18 NOTE — ED Notes (Signed)
Pt in bed, pt calm and cooperative, updated pt on plan of care

## 2022-12-18 NOTE — ED Notes (Addendum)
Pt sitting at end of stretcher at this time.  Provider notified of same.  Pt continues to refuse lab work or monitoring equipment.
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
## 2022-12-18 NOTE — ED Notes (Signed)
Ivc paperwork now attached to the clipboard in green zone  secretary Rolland Bimler is aware

## 2022-12-18 NOTE — ED Notes (Addendum)
Pt in bed, pt calm and cooperative, pt reports 8/10 pain, pain med given, reviewed PRN with pt, pt is oriented to person and place, doesn't know day of the week, re oriented pt, sitter at bedside.  Pt has palpable pedal pulse in R leg, positive sensation to touch, color wdl.
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
## 2022-12-19 ENCOUNTER — Encounter (HOSPITAL_COMMUNITY): Payer: Self-pay | Admitting: Internal Medicine

## 2022-12-19 ENCOUNTER — Encounter (HOSPITAL_COMMUNITY)
Admission: EM | Disposition: A | Discharge status: Home / Self Care | Payer: Self-pay | Source: Home / Self Care | Attending: Internal Medicine

## 2022-12-19 ENCOUNTER — Inpatient Hospital Stay (HOSPITAL_COMMUNITY): Payer: Medicaid Other

## 2022-12-19 ENCOUNTER — Inpatient Hospital Stay (HOSPITAL_COMMUNITY): Payer: Medicaid Other | Admitting: Anesthesiology

## 2022-12-19 DIAGNOSIS — D649 Anemia, unspecified: Secondary | ICD-10-CM

## 2022-12-19 DIAGNOSIS — T84020A Dislocation of internal right hip prosthesis, initial encounter: Secondary | ICD-10-CM

## 2022-12-19 DIAGNOSIS — S73014A Posterior dislocation of right hip, initial encounter: Secondary | ICD-10-CM | POA: Diagnosis not present

## 2022-12-19 DIAGNOSIS — F17210 Nicotine dependence, cigarettes, uncomplicated: Secondary | ICD-10-CM

## 2022-12-19 DIAGNOSIS — I10 Essential (primary) hypertension: Secondary | ICD-10-CM

## 2022-12-19 HISTORY — PX: HIP CLOSED REDUCTION: SHX983

## 2022-12-19 LAB — CBC WITH DIFFERENTIAL/PLATELET
Abs Immature Granulocytes: 0.03 10*3/uL (ref 0.00–0.07)
Basophils Absolute: 0 10*3/uL (ref 0.0–0.1)
Basophils Relative: 1 %
Eosinophils Absolute: 0.3 10*3/uL (ref 0.0–0.5)
Eosinophils Relative: 4 %
HCT: 34.4 % — ABNORMAL LOW (ref 39.0–52.0)
Hemoglobin: 12.2 g/dL — ABNORMAL LOW (ref 13.0–17.0)
Immature Granulocytes: 1 %
Lymphocytes Relative: 30 %
Lymphs Abs: 2 10*3/uL (ref 0.7–4.0)
MCH: 30.4 pg (ref 26.0–34.0)
MCHC: 35.5 g/dL (ref 30.0–36.0)
MCV: 85.8 fL (ref 80.0–100.0)
Monocytes Absolute: 0.6 10*3/uL (ref 0.1–1.0)
Monocytes Relative: 8 %
Neutro Abs: 3.8 10*3/uL (ref 1.7–7.7)
Neutrophils Relative %: 56 %
Platelets: 289 10*3/uL (ref 150–400)
RBC: 4.01 MIL/uL — ABNORMAL LOW (ref 4.22–5.81)
RDW: 11.9 % (ref 11.5–15.5)
WBC: 6.6 10*3/uL (ref 4.0–10.5)
nRBC: 0 % (ref 0.0–0.2)

## 2022-12-19 LAB — BASIC METABOLIC PANEL
Anion gap: 7 (ref 5–15)
BUN: 13 mg/dL (ref 6–20)
CO2: 27 mmol/L (ref 22–32)
Calcium: 8.5 mg/dL — ABNORMAL LOW (ref 8.9–10.3)
Chloride: 99 mmol/L (ref 98–111)
Creatinine, Ser: 0.72 mg/dL (ref 0.61–1.24)
GFR, Estimated: >60 mL/min (ref >60)
Glucose, Bld: 116 mg/dL — ABNORMAL HIGH (ref 70–99)
Potassium: 3.7 mmol/L (ref 3.5–5.1)
Sodium: 133 mmol/L — ABNORMAL LOW (ref 135–145)

## 2022-12-19 LAB — MAGNESIUM: Magnesium: 2.2 mg/dL (ref 1.7–2.4)

## 2022-12-19 SURGERY — CLOSED REDUCTION, HIP
Anesthesia: General | Site: Hip | Laterality: Right

## 2022-12-19 MED ORDER — PROPOFOL 10 MG/ML IV BOLUS
INTRAVENOUS | Status: DC | PRN
  Administered 2022-12-19: 200 mg via INTRAVENOUS

## 2022-12-19 MED ORDER — FENTANYL CITRATE (PF) 250 MCG/5ML IJ SOLN
INTRAMUSCULAR | Status: DC | PRN
  Administered 2022-12-19: 150 ug via INTRAVENOUS

## 2022-12-19 MED ORDER — MIDAZOLAM HCL 2 MG/2ML IJ SOLN
INTRAMUSCULAR | Status: DC | PRN
  Administered 2022-12-19: 2 mg via INTRAVENOUS

## 2022-12-19 MED ORDER — FENTANYL CITRATE (PF) 100 MCG/2ML IJ SOLN
INTRAMUSCULAR | Status: AC
  Filled 2022-12-19: qty 2

## 2022-12-19 MED ORDER — AMISULPRIDE (ANTIEMETIC) 5 MG/2ML IV SOLN: 10.0000 mg | Freq: Once | INTRAVENOUS | Status: DC | PRN

## 2022-12-19 MED ORDER — SUGAMMADEX SODIUM 200 MG/2ML IV SOLN
INTRAVENOUS | Status: DC | PRN
  Administered 2022-12-19: 400 mg via INTRAVENOUS

## 2022-12-19 MED ORDER — ROCURONIUM BROMIDE 10 MG/ML (PF) SYRINGE
PREFILLED_SYRINGE | INTRAVENOUS | Status: DC | PRN
  Administered 2022-12-19: 30 mg via INTRAVENOUS

## 2022-12-19 MED ORDER — DULOXETINE HCL 20 MG PO CPEP
20.0000 mg | ORAL_CAPSULE | Freq: Every day | ORAL | Status: DC
  Administered 2022-12-19 – 2022-12-20 (×2): 20 mg via ORAL
  Filled 2022-12-19 (×2): qty 1

## 2022-12-19 MED ORDER — DEXAMETHASONE SODIUM PHOSPHATE 10 MG/ML IJ SOLN
INTRAMUSCULAR | Status: DC | PRN
  Administered 2022-12-19: 10 mg via INTRAVENOUS

## 2022-12-19 MED ORDER — CHLORHEXIDINE GLUCONATE 0.12 % MT SOLN
15.0000 mL | Freq: Once | OROMUCOSAL | Status: AC
  Administered 2022-12-19: 15 mL via OROMUCOSAL

## 2022-12-19 MED ORDER — LACTATED RINGERS IV SOLN: INTRAVENOUS | Status: DC

## 2022-12-19 MED ORDER — PROMETHAZINE HCL 25 MG/ML IJ SOLN: 6.2500 mg | INTRAMUSCULAR | Status: DC | PRN

## 2022-12-19 MED ORDER — FENTANYL CITRATE (PF) 250 MCG/5ML IJ SOLN
INTRAMUSCULAR | Status: AC
  Filled 2022-12-19: qty 5

## 2022-12-19 MED ORDER — MIDAZOLAM HCL 2 MG/2ML IJ SOLN
INTRAMUSCULAR | Status: AC
  Filled 2022-12-19: qty 2

## 2022-12-19 MED ORDER — CHLORHEXIDINE GLUCONATE 4 % EX SOLN: 60.0000 mL | Freq: Once | CUTANEOUS | Status: DC

## 2022-12-19 MED ORDER — LIDOCAINE 2% (20 MG/ML) 5 ML SYRINGE
INTRAMUSCULAR | Status: DC | PRN
  Administered 2022-12-19: 60 mg via INTRAVENOUS

## 2022-12-19 MED ORDER — SUCCINYLCHOLINE CHLORIDE 200 MG/10ML IV SOSY
PREFILLED_SYRINGE | INTRAVENOUS | Status: DC | PRN
  Administered 2022-12-19: 80 mg via INTRAVENOUS

## 2022-12-19 MED ORDER — ORAL CARE MOUTH RINSE: 15.0000 mL | Freq: Once | OROMUCOSAL | Status: AC

## 2022-12-19 MED ORDER — PROPOFOL 10 MG/ML IV BOLUS
INTRAVENOUS | Status: AC
  Filled 2022-12-19: qty 20

## 2022-12-19 MED ORDER — FENTANYL CITRATE (PF) 100 MCG/2ML IJ SOLN
25.0000 ug | INTRAMUSCULAR | Status: DC | PRN
  Administered 2022-12-19: 50 ug via INTRAVENOUS

## 2022-12-19 MED ORDER — ACETAMINOPHEN 10 MG/ML IV SOLN: 1000.0000 mg | Freq: Once | INTRAVENOUS | Status: DC | PRN

## 2022-12-19 MED ORDER — POVIDONE-IODINE 10 % EX SWAB
2.0000 | Freq: Once | CUTANEOUS | Status: AC
  Administered 2022-12-19: 2 via TOPICAL

## 2022-12-19 MED ORDER — ONDANSETRON HCL 4 MG/2ML IJ SOLN
INTRAMUSCULAR | Status: DC | PRN
  Administered 2022-12-19: 4 mg via INTRAVENOUS

## 2022-12-19 MED ORDER — OXYCODONE HCL 5 MG/5ML PO SOLN: 5.0000 mg | Freq: Once | ORAL | Status: DC | PRN

## 2022-12-19 MED ORDER — OXYCODONE HCL 5 MG PO TABS: 5.0000 mg | ORAL_TABLET | Freq: Once | ORAL | Status: DC | PRN

## 2022-12-19 SURGICAL SUPPLY — 1 items: IMMOBILIZER KNEE 22 UNIV (SOFTGOODS) IMPLANT

## 2022-12-19 NOTE — Anesthesia Postprocedure Evaluation (Signed)
Anesthesia Post Note  Patient: Bradley Mcgrath  Procedure(s) Performed: CLOSED REDUCTION HIP (Right: Hip)     Patient location during evaluation: PACU Anesthesia Type: General Level of consciousness: awake Pain management: pain level controlled Vital Signs Assessment: post-procedure vital signs reviewed and stable Respiratory status: spontaneous breathing, nonlabored ventilation and respiratory function stable Cardiovascular status: blood pressure returned to baseline and stable Postop Assessment: no apparent nausea or vomiting Anesthetic complications: no   No notable events documented.  Last Vitals:  Vitals:   12/19/22 1130 12/19/22 1138  BP: 133/67 114/60  Pulse: 69 76  Resp: 17 13  Temp:  36.5 C  SpO2: 98% 98%    Last Pain:  Vitals:   12/19/22 1154  TempSrc:   PainSc: 4                  Tammi Boulier P Candela Krul

## 2022-12-19 NOTE — Interval H&P Note (Signed)
History and Physical Interval Note:  12/19/2022 9:54 AM  Bradley Mcgrath  has presented today for surgery, with the diagnosis of right hip dislocation.  The various methods of treatment have been discussed with the patient and family. After consideration of risks, benefits and other options for treatment, the patient has consented to  Procedure(s): CLOSED REDUCTION HIP (Right) as a surgical intervention.  The patient's history has been reviewed, patient examined, no change in status, stable for surgery.  I have reviewed the patient's chart and labs.  Questions were answered to the patient's satisfaction.     Caryn Bee P Otilia Kareem
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
## 2022-12-19 NOTE — Progress Notes (Signed)
PROGRESS NOTE Bradley Mcgrath  WUJ:[id number] DOB: [birth year] DOA: 12/17/2022 PCP: Pcp, No  Brief Narrative/Hospital Course: 42 year old male with history of schizophrenia depression, hypertension, kidney stone, on nasal naloxone and Suboxone for, smoking use and history of right hip arthroplasty coming to the ED again with right hip pain. He has had multiple ED visits and has been living AGAINST MEDICAL ADVICE  In the ED vitals stable, labs shows fairly stable CMP CBC with mild anemia TSH is only slightly high free T4 normal INR 1.1 UA unremarkable UDS was negative, alcohol level less than 10.  uds previously on 4/30 was positive for amphetamine benzo opiates and THC In ED unsuccessful attempt was made to reduce the hip.  Patient later noted to have psychosis and aggression given Geodon and Ativan and placed under IVC orthopedic was consulted and admitted under hospitalist service. Psychiatry was consulted.      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 Per orthopedics weightbearing as tolerated to the right lower extremity, no DVT prophylaxis needed no postop antibiotics needed, continue posterior hip precautions to prevent dislocation.  Pain management as per orthopedics.  History of schizophrenia Paranoid delusion Homelessness On chronic Suboxone: Patient has been placed under IVC on admission.  Psychiatry has been consulted.  No SI and Homicidal ideation.  He is  alert awake oriented communicative.  Continue his current pain management, home Suboxone Neurontin nicotine patch.  Await further psychiatric decision for inpatient admission -cont IVC per psych.   Substance abuse history currently UDS negative but uds previously on 4/30 was positive for amphetamine benzo opiates and THC. Toc consult.  DVT prophylaxis: none- SCD Code Status:   Code Status: Full Code Family Communication: plan of care discussed with patient/ at bedside. Patient status is: Inpatient because of hip dislocation needing inpatient admission Level of care: Telemetry Medical   Dispo: The patient is from: HOMELESS            Anticipated disposition: TBD Objective: Vitals last 24 hrs: Vitals:   12/19/22 0552 12/19/22 0659 12/19/22 0933 12/19/22 1100  BP: (!) 89/68 120/73 129/73 (!) 141/76  Pulse: 68 62 71 81  Resp: 17  18 15   Temp: 98.2 F (36.8 C)  97.9 F (36.6 C) 97.7 F (36.5 C)  TempSrc: Oral  Oral   SpO2: 98%  98% 98%  Height:       Weight change:   Physical Examination: General exam: alert awake, older than stated age HEENT:Oral mucosa moist, Ear/Nose WNL grossly Respiratory system: bilaterally clear BS, no use of accessory muscle Cardiovascular system: S1 & S2 +, No JVD. Gastrointestinal system: Abdomen soft,NT,ND, BS+ Nervous System:Alert, awake, moving extremities. Extremities: LE edema neg,distal peripheral pulses palpable.  Skin: No rashes,no icterus. MSK: Normal muscle bulk,tone, power  Medications reviewed:  Scheduled Meds:  [MAR Hold] buprenorphine-naloxone  2 tablet Sublingual BID   chlorhexidine  60 mL Topical Once   [MAR Hold] nicotine  14 mg Transdermal Daily   [MAR Hold]  risperiDONE  2 mg Oral QHS   Continuous Infusions:  acetaminophen     lactated ringers      Diet Order             Diet regular Fluid consistency: Thin  Diet effective now                   Intake/Output Summary (Last 24 hours) at 12/19/2022 1112 Last data filed  at 12/18/2022 2330 Gross per 24 hour  Intake 495.93 ml  Output --  Net 495.93 ml   Net IO Since Admission: 495.93 mL [12/19/22 1112]  Wt Readings from Last 3 Encounters:  12/11/22 78.5 kg  12/10/22 78.5 kg  06/21/22 79.4 kg     Unresulted Labs (From admission, onward)    None     Data Reviewed: I have personally reviewed following labs and imaging studies CBC: Recent Labs  Lab 12/18/22 0004 12/19/22 0130  WBC 6.3 6.6  NEUTROABS 3.4 3.8  HGB 12.2* 12.2*  HCT 36.1* 34.4*  MCV 89.4 85.8  PLT 290 289   Basic Metabolic Panel: Recent Labs  Lab 12/18/22 0004 12/19/22 0130  NA 135 133*  K 3.8 3.7  CL 103 99  CO2 26 27  GLUCOSE 107* 116*  BUN 8 13  CREATININE 0.66 0.72  CALCIUM 8.5* 8.5*  MG  --  2.2   GFR: Estimated Creatinine Clearance: 133.4 mL/min (by C-G formula based on SCr of 0.72 mg/dL). Liver Function Tests: Recent Labs  Lab 12/18/22 0004  AST 19  ALT 23  ALKPHOS 107  BILITOT 0.3  PROT 6.1*  ALBUMIN 2.7*  Coagulation Profile: Recent Labs  Lab 12/18/22 0004  INR 1.1   Recent Labs    12/18/22 0004  TSH 6.469*  FREET4 1.06   Sepsis Labs: No results for input(s): "PROCALCITON", "LATICACIDVEN" in the last 168 hours.  Recent Results (from the past 240 hour(s))  MRSA Next Gen by PCR, Nasal     Status: None   Collection Time: 12/18/22  3:56 PM   Specimen: Nasal Mucosa; Nasal Swab  Result Value Ref Range Status   MRSA by PCR Next Gen NOT DETECTED NOT DETECTED Final    Comment: (NOTE) The GeneXpert MRSA Assay (FDA approved for NASAL specimens only), is one component of a comprehensive MRSA colonization surveillance program. It is not intended to diagnose MRSA infection nor to guide or monitor treatment for MRSA infections. Test performance is not FDA approved in patients less than 19 years old. Performed at [facility], [address] [place], [place] [address]     Antimicrobials: Anti-infectives (From admission, onward)    None       Culture/Microbiology    Component Value Date/Time   SDES BLOOD LEFT HAND 01/10/2016 2055   SPECREQUEST BOTTLES DRAWN AEROBIC AND ANAEROBIC 5 ML 01/10/2016 2055   CULT  01/10/2016 2055    NO GROWTH 5 DAYS Performed at [facility]    REPTSTATUS 01/15/2016 FINAL 01/10/2016 2055   Radiology Studies: DG Hip Unilat W or Wo Pelvis 2-3 Views Right  Result Date: 12/17/2022 CLINICAL DATA:  Hip dislocation EXAM: DG HIP (WITH OR WITHOUT PELVIS) 2-3V RIGHT COMPARISON:  Right hip x-ray 12/10/2022 FINDINGS: There is posterosuperior dislocation of the femoral head component in relation to the acetabular cup. No acute fracture or hardware loosening identified. There are likely healed right pubic rami fractures. IMPRESSION: Posterosuperior dislocation of the right hip arthroplasty. Electronically Signed   By: Darliss Cheney  M.D.   On: 12/17/2022 16:38     LOS: 1 day   Lanae Boast, MD [facility]  12/19/2022, 11:12 AM

## 2022-12-19 NOTE — Anesthesia Procedure Notes (Signed)
Procedure Name: Intubation Date/Time: 12/19/2022 10:41 AM  Performed by: Macie Burows, CRNAPre-anesthesia Checklist: Patient identified, Emergency Drugs available, Suction available and Patient being monitored Patient Re-evaluated:Patient Re-evaluated prior to induction Oxygen Delivery Method: Circle system utilized Preoxygenation: Pre-oxygenation with 100% oxygen Induction Type: IV induction Ventilation: Mask ventilation without difficulty Laryngoscope Size: Mac and 4 Grade View: Grade I Tube type: Oral Tube size: 7.5 mm Number of attempts: 1 Airway Equipment and Method: Stylet and Oral airway Placement Confirmation: ETT inserted through vocal cords under direct vision, positive ETCO2 and breath sounds checked- equal and bilateral Secured at: 22 cm Tube secured with: Tape Dental Injury: Teeth and Oropharynx as per pre-operative assessment

## 2022-12-19 NOTE — Anesthesia Preprocedure Evaluation (Signed)
Anesthesia Evaluation  Patient identified by MRN, date of birth, ID band Patient awake    Reviewed: Allergy & Precautions, NPO status , Patient's Chart, lab work & pertinent test results  Airway Mallampati: II  TM Distance: >3 FB Neck ROM: Full    Dental  (+) Chipped,    Pulmonary Current Smoker and Patient abstained from smoking.   Pulmonary exam normal        Cardiovascular hypertension, negative cardio ROS Normal cardiovascular exam     Neuro/Psych  PSYCHIATRIC DISORDERS  Depression  Schizophrenia  CVA    GI/Hepatic negative GI ROS,,,(+)     substance abuse    Endo/Other  negative endocrine ROS    Renal/GU Renal disease     Musculoskeletal negative musculoskeletal ROS (+)  narcotic dependent  Abdominal   Peds  Hematology  (+) Blood dyscrasia, anemia , REFUSES BLOOD PRODUCTS  Anesthesia Other Findings right hip dislocation  Reproductive/Obstetrics                             Anesthesia Physical Anesthesia Plan  ASA: 3  Anesthesia Plan: General   Post-op Pain Management:    Induction: Intravenous  PONV Risk Score and Plan: 1 and Ondansetron, Dexamethasone, Midazolam and Treatment may vary due to age or medical condition  Airway Management Planned: Oral ETT  Additional Equipment:   Intra-op Plan:   Post-operative Plan: Extubation in OR  Informed Consent: I have reviewed the patients History and Physical, chart, labs and discussed the procedure including the risks, benefits and alternatives for the proposed anesthesia with the patient or authorized representative who has indicated his/her understanding and acceptance.     Dental advisory given  Plan Discussed with: CRNA  Anesthesia Plan Comments:        Anesthesia Quick Evaluation

## 2022-12-19 NOTE — Transfer of Care (Signed)
Immediate Anesthesia Transfer of Care Note  Patient: Bradley Mcgrath  Procedure(s) Performed: CLOSED REDUCTION HIP (Right: Hip)  Patient Location: PACU  Anesthesia Type:General  Level of Consciousness: awake, alert , and oriented  Airway & Oxygen Therapy: Patient Spontanous Breathing  Post-op Assessment: Report given to RN and Post -op Vital signs reviewed and stable  Post vital signs: Reviewed and stable  Last Vitals:  Vitals Value Taken Time  BP 141/76 12/19/22 1100  Temp 36.5 C 12/19/22 1100  Pulse 80 12/19/22 1108  Resp 18 12/19/22 1108  SpO2 99 % 12/19/22 1108  Vitals shown include unvalidated device data.  Last Pain:  Vitals:   12/19/22 1100  TempSrc:   PainSc: 0-No pain         Complications: No notable events documented.

## 2022-12-19 NOTE — Op Note (Signed)
Orthopaedic Surgery Operative Note (CSN: [id number] ) Date of Surgery: 12/19/2022  Admit Date: 12/17/2022   Diagnoses: Pre-Op Diagnoses: Right periprosthetic hip dislocation  Post-Op Diagnosis: Same  Procedures: CPT 27266-Closed reduction of right hip dislocation  Surgeons : Primary: Roby Lofts, MD  Assistant: Ulyses Southward, PA-C  Location: OR 3   Anesthesia:General   Antibiotics: None   Tourniquet time: None    Estimated Blood Loss: None  Complications:* No complications entered in OR log *   Specimens:* No specimens in log *   Implants: * No implants in log *   Indications for Surgery: 42 year old male who sustained a periprosthetic hip dislocation.  It has been out for nearly 2 weeks.  Patient has been leaving AGAINST MEDICAL ADVICE prior to getting a formal closed reduction.  Failed attempts have been performed in the emergency room.  I felt that he required close reduction in the operating room.  Risk and benefits were discussed with the patient.  He agreed to proceed with surgery and consent was obtained.  Operative Findings: Successful closed reduction of right hip dislocation without complication.  Procedure: The patient was identified in the preoperative holding area. Consent was confirmed with the patient and their family and all questions were answered. The operative extremity was marked after confirmation with the patient. he was then brought back to the operating room by our anesthesia colleagues.  He was carefully transferred over to radiolucent flattop table.  He was placed under general anesthetic.  A timeout was performed to verify the patient, the procedure, and the extremity.  Fluoroscopic imaging showed the dislocation of his hip.  The hip was then flexed, adducted, and traction was applied with a anterior directed pressure to the posterior hip.  With forced traction and manipulation of the hip was able to reduce concentrically.  Fluoroscopic imaging  showed adequate reduction.  The patient was then awoken from anesthesia and taken to the PACU in stable condition.  A knee immobilizer was placed to his right lower extremity to prevent flexion of his hip.  Post Op Plan/Instructions: Patient will be weightbearing as tolerated to the right lower extremity.  No DVT prophylaxis is needed from an orthopedic perspective.  No postoperative antibiotics are needed.  Posterior hip precautions to prevent dislocation.  I was present and performed the entire surgery.  Ulyses Southward, PA-C did assist me throughout the case. An assistant was necessary given the difficulty in approach, maintenance of reduction and ability to instrument the fracture.   Truitt Merle, MD [facility]

## 2022-12-20 ENCOUNTER — Encounter (HOSPITAL_COMMUNITY): Payer: Self-pay | Admitting: Student

## 2022-12-20 ENCOUNTER — Other Ambulatory Visit (HOSPITAL_COMMUNITY): Payer: Self-pay

## 2022-12-20 DIAGNOSIS — S73014A Posterior dislocation of right hip, initial encounter: Secondary | ICD-10-CM | POA: Diagnosis not present

## 2022-12-20 MED ORDER — DULOXETINE HCL 20 MG PO CPEP
20.0000 mg | ORAL_CAPSULE | Freq: Every day | ORAL | 0 refills | Status: AC
  Filled 2022-12-20: qty 30, 30d supply, fill #0

## 2022-12-20 MED ORDER — RISPERIDONE 2 MG PO TABS
2.0000 mg | ORAL_TABLET | Freq: Every day | ORAL | 0 refills | Status: AC
  Filled 2022-12-20: qty 3, 3d supply, fill #0

## 2022-12-20 NOTE — Progress Notes (Addendum)
Per psych, pt no longer meets IVC criteria. Change in commitment form complete. Unable to add change in commitment form to existing eIVC case due to no documented case # from initial IVC completed in the ED. Completed a new initial eIVC process and called magistrate to updated them the request is not actually a new petition but is a change in commitment with the necessary form attached, they verbalized understanding. Case number is currently pending, envelope [id number].   Bus passes provided along with shelter list.    UPDATE: psych APP received case number from [place] Court [phone] [id number]. Change in Commitment form added to above case number in eCourts system.   Dellie Burns, MSW, LCSW [phone] (coverage)

## 2022-12-20 NOTE — Progress Notes (Signed)
Orthopaedic Trauma Progress Note  SUBJECTIVE: Doing fairly well this morning.  Notes some soreness in the right hip but no significant pain.  Was able to put some weight on the right leg when mobilizing to the bathroom this morning.  Denies any numbness or tingling through the right leg. No chest pain. No SOB. No nausea/vomiting. No other complaints. Safety sitter at bedside Patient notes he previously wore a pelvic binder/lumbar support brace when he was working and he felt this helped to stabilize his hip and prevent dislocation. He is asking if there is something similar he could use now  OBJECTIVE:  Vitals:   12/19/22 2132 12/20/22 0742  BP: 128/68 126/75  Pulse: 90 69  Resp:    Temp: 98.9 F (37.2 C) 98.4 F (36.9 C)  SpO2: 99% 100%    General: Sitting up in bed, no acute distress Respiratory: No increased work of breathing.  Right lower extremity: Knee immobilizer in place.  No significant tenderness with palpation of the hip.  Toes held in a curled/flexed position, patient states this is his baseline.  Foot warm and well-perfused. + DP pulse  IMAGING: Stable postreduction imaging LABS: No results found for this or any previous visit (from the past 24 hour(s)).  ASSESSMENT: Bradley Mcgrath is a 42 y.o. male, 1 Day Post-Op s/p CLOSED REDUCTION RIGHT HIP  CV/Blood loss: Hemoglobin 12.2 preoperatively.  Hemodynamically stable  PLAN: Weightbearing: WBAT RLE in knee immobilizer ROM: Posterior hip precautions Showering: Okay to shower Orthopedic device(s): Knee immobilizer RLE at all times except when showering Pain management:  1. Tylenol 650 mg q 6 hours PRN 2.  Neurontin 800 mg 3 times daily PRN 3. Oxycodone 5 mg q 4 hours PRN 4.  Morphine 1 mg q 3 hours PRN VTE prophylaxis: SCDs ID: None Foley/Lines:  No foley, KVO IVFs Dispo: PT/OT as tolerated.  Okay for discharge from ortho standpoint once cleared by medicine team and therapies   Follow - up plan: 2 weeks after  d/c   Contact information:  Truitt Merle MD, Thyra Breed PA-C. After hours and holidays please check [email or web address] for group call information for [facility]   Thompson Caul, PA-C [phone] (office) [email or web address]

## 2022-12-20 NOTE — Progress Notes (Signed)
PROGRESS NOTE Bradley Mcgrath  MWU:[id number] DOB: 08/20/1980 DOA: 12/17/2022 PCP: Pcp, No  Brief Narrative/Hospital Course: 42 year old male with history of schizophrenia depression, hypertension, kidney stone, on nasal naloxone and Suboxone for, smoking use and history of right hip arthroplasty coming to the ED again with right hip pain. He has had multiple ED visits and has been living AGAINST MEDICAL ADVICE  In the ED vitals stable, labs shows fairly stable CMP CBC with mild anemia TSH is only slightly high free T4 normal INR 1.1 UA unremarkable UDS was negative, alcohol level less than 10.  uds previously on 4/30 was positive for amphetamine benzo opiates and THC In ED unsuccessful attempt was made to reduce the hip.  Patient later noted to have psychosis and aggression given Geodon and Ativan and placed under IVC orthopedic was consulted and admitted under hospitalist service. Psychiatry was consulted. 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 Cont weightbearing as tolerated to the right lower extremity, no DVT prophylaxis needed no postop antibiotics needed, continue posterior hip precautions to prevent dislocation.  Cont pain management as per orthopedics.  History of schizophrenia Paranoid delusion Homelessness On chronic Suboxone: Patient has been placed under  IVC on admission.Psychiatry has been consulted and pending inpatient psych admission. He is alert awake oriented communicative. Cont home Subxone Neurontin nicotine patch.    Substance abuse history currently UDS negative but uds previously on 4/30 was positive for amphetamine benzo opiates and THC. Toc consult.  DVT prophylaxis: Place and maintain sequential compression device Start: 12/19/22 1116none- SCD Code Status:   Code Status: Full Code Family Communication: plan of care discussed with patient/ at bedside. Patient status is: Inpatient because of hip dislocation needing inpatient admission Level of care: Telemetry Medical   Dispo: The patient is from: HOMELESS            Anticipated disposition: he is medically stable for discharge. Await psych plan for inpatient psych admission Objective: Vitals last 24 hrs: Vitals:   12/19/22 1130 12/19/22 1138 12/19/22 2132 12/20/22 0742  BP: 133/67 114/60 128/68 126/75  Pulse: 69 76 90 69  Resp: 17 13    Temp:  97.7 F (36.5 C) 98.9 F (37.2 C) 98.4 F (36.9 C)  TempSrc:   Oral Oral  SpO2: 98% 98% 99% 100%  Height:       Weight change:   Physical Examination: General exam: AAox3,weak,older appearing. HEENT:Oral mucosa moist,Ear/Nose WNL grossly, dentition normal. Respiratory system:Bilaterally clear BS,no use of accessory muscle. Cardiovascular system:S1 & S2 +,regular rate. Gastrointestinal system:Abdomen soft,NT,ND,BS+. Nervous System:Alert, awake, moving extremities and grossly nonfocal. Extremities:LE ankle edema neg,lower extremities warm. Skin:No rashes,no icterus. VOZ:DGUYQI muscle bulk,tone, power.   Medications reviewed:  Scheduled Meds:  buprenorphine-naloxone  2 tablet Sublingual BID   DULoxetine  20 mg Oral Daily   nicotine  14 mg Transdermal Daily  risperiDONE  2 mg Oral QHS  Continuous Infusions:   Diet Order             Diet regular Fluid consistency: Thin  Diet effective now                   Intake/Output Summary (Last 24 hours) at 12/20/2022 1318 Last data filed at 12/20/2022 1100 Gross per 24 hour  Intake 1560 ml  Output --  Net 1560 ml  Net IO Since Admission: 2,415.93 mL [12/20/22 1318]  Wt Readings from Last 3 Encounters:  12/11/22 78.5 kg  12/10/22 78.5 kg  06/21/22 79.4 kg    Unresulted Labs (From admission, onward)    None     Data Reviewed: I have personally reviewed following labs and imaging studies Recent Labs  Lab 12/18/22 0004 12/19/22 0130  WBC 6.3 6.6  NEUTROABS 3.4 3.8  HGB 12.2* 12.2*  HCT 36.1* 34.4*  MCV 89.4 85.8  PLT 290 289   Basic Metabolic Panel: Recent Labs  Lab 12/18/22 0004 12/19/22 0130  NA 135 133*  K 3.8 3.7  CL 103 99  CO2 26 27  GLUCOSE 107* 116*  BUN 8 13  CREATININE 0.66 0.72  CALCIUM 8.5* 8.5*  MG  --  2.2  WJX:[id number] Creatinine Clearance: 133.4 mL/min (by C-G formula based on SCr of 0.72 mg/dL). Liver Function Tests: Recent Labs  Lab 12/18/22 0004  AST 19  ALT 23  ALKPHOS 107  BILITOT 0.3  PROT 6.1*  ALBUMIN 2.7*  Coagulation Profile: Recent Labs  Lab 12/18/22 0004  INR 1.1   Recent Labs    12/18/22 0004  TSH 6.469*  FREET4 1.06  Sepsis Labs:No results for input(s): "PROCALCITON", "LATICACIDVEN" in the last 168 hours. Recent Results (from the past 240 hour(s))  MRSA Next Gen by PCR, Nasal     Status: None   Collection Time: 12/18/22  3:56 PM   Specimen: Nasal Mucosa; Nasal Swab  Result Value Ref Range Status   MRSA by PCR Next Gen NOT DETECTED NOT DETECTED Final    Comment: (NOTE) The GeneXpert MRSA Assay (FDA approved for NASAL specimens only), is one component of a comprehensive MRSA colonization surveillance program. It is not intended to diagnose MRSA infection nor to guide or monitor treatment for MRSA infections. Test performance is not FDA approved in patients less than 52 years old. Performed at [facility], [address], [place], [place] [address]      Antimicrobials: Anti-infectives (From admission, onward)    None      Culture/Microbiology    Component Value Date/Time   SDES BLOOD LEFT HAND 01/10/2016 2055   SPECREQUEST BOTTLES DRAWN AEROBIC AND ANAEROBIC 5 ML 01/10/2016 2055   CULT  01/10/2016 2055    NO GROWTH 5 DAYS Performed at [facility]    REPTSTATUS 01/15/2016 FINAL 01/10/2016 2055   Radiology Studies: DG HIP PORT UNILAT W OR W/O PELVIS 1V RIGHT  Result Date: 12/19/2022 CLINICAL DATA:  [id number] Status post closed reduction of dislocated total hip prosthesis [id number] EXAM: DG HIP (WITH OR WITHOUT PELVIS) 1V PORT RIGHT COMPARISON:  None Available. FINDINGS: Normal alignment of the right hip arthroplasty after reduction. No evidence of loosening or periprosthetic fracture. IMPRESSION: Normal right hip arthroplasty alignment after reduction. No evidence of loosening or fracture. Electronically Signed   By: Caprice Renshaw M.D.   On: 12/19/2022 12:17   DG HIP UNILAT WITH PELVIS 1V RIGHT  Result Date: 12/19/2022 CLINICAL DATA:  Elective surgery. Closed reduction of right hip. EXAM: DG HIP (WITH OR WITHOUT PELVIS) 1V RIGHT COMPARISON:  Preoperative imaging. FINDINGS: Four fluoroscopic spot views of the hip obtained in the operating room. Interval reduction of right hip arthroplasty dislocation. Fluoroscopy time 18 seconds. Dose 2.18 mGy. IMPRESSION: Intraoperative fluoroscopy during closed reduction of right hip arthroplasty dislocation. Electronically Signed   By: Narda Rutherford M.D.   On: 12/19/2022 11:24   DG C-Arm 1-60 Min-No Report  Result Date: 12/19/2022 Fluoroscopy was utilized by the requesting physician.  No radiographic interpretation.     LOS: 2 days   Lanae Boast, MD [facility]  12/20/2022, 1:18 PM

## 2022-12-20 NOTE — Discharge Summary (Signed)
Physician Discharge Summary  Bradley Mcgrath UJW:[id number] DOB: [birth year] DOA: 12/17/2022  PCP: Pcp, No  Admit date: 12/17/2022 Discharge date: 12/20/2022 Recommendations for Outpatient Follow-up:  Follow up with PCP in 1 weeks-call for appointment Please obtain BMP/CBC in one week  Discharge Dispo: self care Discharge Condition: Stable Code Status:   Code Status: Full Code Diet recommendation:  Diet Order             Diet regular Fluid consistency: Thin  Diet effective now                    Brief/Interim Summary: 42 year old male with history of schizophrenia depression, hypertension, kidney stone, on nasal naloxone and Suboxone for, smoking use and history of right hip arthroplasty coming to the ED again with right hip pain. He has had multiple ED visits and has been living AGAINST MEDICAL ADVICE  In the ED vitals stable, labs shows fairly stable CMP CBC with mild anemia TSH is only slightly high free T4 normal INR 1.1 UA unremarkable UDS was negative, alcohol level less than 10.  uds previously on 4/30 was positive for amphetamine benzo opiates and THC In ED unsuccessful attempt was made to reduce the hip.  Patient later noted to have psychosis and aggression given Geodon and Ativan and placed under IVC orthopedic was consulted and admitted under hospitalist service. Psychiatry was consulted. 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 Cont weightbearing as tolerated to the right lower extremity, no DVT prophylaxis needed no postop antibiotics needed, continue posterior hip precautions to prevent dislocation.    History of schizophrenia Paranoid delusion Homelessness On chronic Suboxone: Patient has been placed under IVC on admission.Psychiatry has been consulted and rescinded IVC no need for inpatient psych and cleared for discharge will continue his home Suboxone Neurontin and given Cymbalta and Risperdal.  Psych arranging outpatient follow-up  Substance abuse history currently UDS negative but uds previously on 4/30 was positive for amphetamine benzo opiates and THC. Toc consult.  Consults: Orthopedics, Psychaitry  Subjective: AAO X 3 Pain controlled  Discharge Exam: Vitals:   12/19/22 2132 12/20/22 0742  BP: 128/68 126/75  Pulse: 90 69  Resp:    Temp: 98.9 F (37.2 C) 98.4 F (36.9 C)  SpO2: 99% 100%   General: Pt is alert, awake, not in acute distress Cardiovascular: RRR, S1/S2 +, no rubs, no gallops Respiratory: CTA bilaterally, no wheezing, no rhonchi Abdominal: Soft, NT, ND, bowel sounds + Extremities: no edema, no cyanosis  Discharge Instructions   Allergies as of 12/20/2022       Reactions   Ketamine Other (See Comments)   Pt states "it killed me". Dose used at home, not under medical care.        Medication List  TAKE these medications    Buprenorphine HCl-Naloxone HCl 8-2 MG Film Place 1 Film under the tongue in the morning, at noon, and at bedtime.   gabapentin 800 MG tablet Commonly known as: NEURONTIN Take 800 mg by mouth 3 (three) times daily as needed (nerve pain).   naloxone 4 MG/0.1ML Liqd nasal spray kit Commonly known as: NARCAN Place 0.4 mg into the nose once.         Follow-up Information     [facility] Follow up in 1 week(s).   Contact information: [address] [place] [address] [phone]               Allergies  Allergen Reactions   Ketamine Other (See Comments)    Pt states "it killed me". Dose used at home, not under medical care.     The results of significant diagnostics from this hospitalization (including imaging, microbiology, ancillary and laboratory) are listed below for reference.    Microbiology: Recent Results (from the past 240 hour(s))  MRSA Next Gen by PCR, Nasal     Status: None   Collection Time: 12/18/22  3:56 PM   Specimen: Nasal Mucosa; Nasal Swab  Result Value Ref Range Status   MRSA by PCR Next Gen NOT DETECTED NOT DETECTED Final    Comment: (NOTE) The GeneXpert MRSA Assay (FDA approved for NASAL specimens only), is one component of a comprehensive MRSA colonization surveillance program. It is not intended to diagnose MRSA infection nor to guide or monitor treatment for MRSA infections. Test performance is not FDA approved in patients less than 49 years old. Performed at [facility], [address] [place], [place] [address]     Procedures/Studies: DG HIP PORT UNILAT W OR W/O PELVIS 1V RIGHT  Result Date: 12/19/2022 CLINICAL DATA:  [id number] Status post closed reduction of dislocated total hip prosthesis [id number] EXAM: DG HIP (WITH OR WITHOUT PELVIS) 1V PORT RIGHT COMPARISON:  None Available. FINDINGS: Normal alignment of the right hip arthroplasty after reduction. No evidence of loosening or periprosthetic fracture. IMPRESSION: Normal right hip arthroplasty alignment after reduction. No evidence of loosening or fracture. Electronically Signed   By: Caprice Renshaw M.D.   On: 12/19/2022 12:17   DG HIP UNILAT WITH PELVIS 1V RIGHT  Result Date: 12/19/2022 CLINICAL DATA:  Elective surgery. Closed reduction of right hip. EXAM: DG HIP (WITH  OR WITHOUT PELVIS) 1V RIGHT COMPARISON:  Preoperative imaging. FINDINGS: Four fluoroscopic spot views of the hip obtained in the operating room. Interval reduction of right hip arthroplasty dislocation. Fluoroscopy time 18 seconds. Dose 2.18 mGy. IMPRESSION: Intraoperative fluoroscopy during closed reduction of right hip arthroplasty dislocation. Electronically Signed   By: Narda Rutherford M.D.   On: 12/19/2022 11:24   DG C-Arm 1-60 Min-No Report  Result Date: 12/19/2022 Fluoroscopy was utilized by the requesting physician.  No radiographic interpretation.   DG Hip Unilat W or Wo Pelvis 2-3 Views Right  Result Date: 12/17/2022 CLINICAL DATA:  Hip dislocation EXAM: DG HIP (WITH OR WITHOUT PELVIS) 2-3V RIGHT COMPARISON:  Right hip x-ray 12/10/2022 FINDINGS: There is posterosuperior dislocation of the femoral head component in relation to the acetabular cup. No acute fracture or hardware loosening identified. There are likely healed right pubic rami fractures. IMPRESSION: Posterosuperior dislocation of the right hip arthroplasty. Electronically Signed   By: Darliss Cheney M.D.   On: 12/17/2022 16:38   DG HIP PORT UNILAT WITH PELVIS 1V RIGHT  Result Date: 12/10/2022 CLINICAL DATA:  [id number] Dislocation closed [id number] EXAM: DG HIP (WITH OR WITHOUT PELVIS) 1V PORT RIGHT COMPARISON:  X-ray pelvis 12/10/2022, x-ray right hip 04/11/2021 x-ray right hip 06/22/2022 FINDINGS: 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 No acute displaced fracture identified. There is no evidence of arthropathy or other focal bone abnormality. 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 Electronically Signed   By: Tish Frederickson M.D.   On: 12/10/2022 22:31   CT HEAD WO CONTRAST ( )  Result Date: 12/10/2022 CLINICAL DATA:  Provided history: Mental status change, unknown cause.  EXAM: CT HEAD WITHOUT CONTRAST TECHNIQUE: Contiguous axial images were obtained from the base of the skull through the vertex without intravenous contrast. RADIATION DOSE REDUCTION: This exam was performed according to the departmental dose-optimization program which includes automated exposure control, adjustment of the mA and/or kV according to patient size and/or use of iterative reconstruction technique. COMPARISON:  Prior head CT examinations 11/01/2020 and earlier. FINDINGS: Brain: No age advanced or lobar predominant parenchymal atrophy. There is no acute intracranial hemorrhage. No demarcated cortical infarct. No extra-axial fluid collection. No evidence of an intracranial mass. No midline shift. Vascular: No hyperdense vessel. Skull: No fracture or aggressive osseous lesion. Sinuses/Orbits: No mass or acute finding within the imaged orbits. Mild-to-moderate mucosal thickening within the right frontal sinus. Minimal mucosal thickening within the left frontal sinus. Mucosal thickening and fluid within bilateral ethmoid air cells, overall mild-to-moderate in severity. Mild mucosal thickening within the right greater than left sphenoid sinuses. Trace mucosal thickening within the right maxillary sinus. IMPRESSION: 1.  No evidence of an acute intracranial abnormality. 2. Paranasal sinus disease, as described. Electronically Signed   By: Jackey Loge D.O.   On: 12/10/2022 14:04   DG Hip Unilat W or Wo Pelvis 2-3 Views Right  Result Date: 12/10/2022 CLINICAL DATA:  Hip pain EXAM: DG HIP (WITH OR WITHOUT PELVIS) 3V RIGHT COMPARISON:  X-ray 06/22/2022 FINDINGS: There is a dislocated right hip arthroplasty. The femoral head component is dorsal to the acetabular cup and there is overlapping. Screw fixated acetabular cup. No separate fracture or dislocation. Chronic appearing deformity of the right inferior pubic ramus. Preserved joint spaces of the left hip and sacroiliac joints. IMPRESSION: Dislocation of the  right hip arthroplasty. Femoral component is dorsal to the acetabular cup and there is foreshortening Electronically Signed   By: Karen Kays M.D.   On: 12/10/2022 12:22    Labs: BNP (last 3 results) No results for input(s): "BNP" in the last 8760 hours. Basic Metabolic Panel: Recent Labs  Lab 12/18/22 0004 12/19/22 0130  NA 135 133*  K 3.8 3.7  CL 103 99  CO2 26 27  GLUCOSE 107* 116*  BUN 8 13  CREATININE 0.66 0.72  CALCIUM 8.5* 8.5*  MG  --  2.2   Liver Function Tests: Recent Labs  Lab 12/18/22 0004  AST 19  ALT 23  ALKPHOS 107  BILITOT 0.3  PROT 6.1*  ALBUMIN 2.7*   No results for input(s): "LIPASE", "AMYLASE" in the last 168 hours. No results for input(s): "AMMONIA" in the last 168 hours. CBC: Recent Labs  Lab 12/18/22 0004 12/19/22 0130  WBC 6.3 6.6  NEUTROABS 3.4 3.8  HGB 12.2* 12.2*  HCT 36.1* 34.4*  MCV 89.4 85.8  PLT 290 289   Cardiac Enzymes: No results for input(s): "CKTOTAL", "CKMB", "CKMBINDEX", "TROPONINI" in  the last 168 hours. BNP: Invalid input(s): "POCBNP" CBG: No results for input(s): "GLUCAP" in the last 168 hours. D-Dimer No results for input(s): "DDIMER" in the last 72 hours. Hgb A1c No results for input(s): "HGBA1C" in the last 72 hours. Lipid Profile No results for input(s): "CHOL", "HDL", "LDLCALC", "TRIG", "CHOLHDL", "LDLDIRECT" in the last 72 hours. Thyroid function studies Recent Labs    12/18/22 0004  TSH 6.469*   Anemia work up No results for input(s): "VITAMINB12", "FOLATE", "FERRITIN", "TIBC", "IRON", "RETICCTPCT" in the last 72 hours. Urinalysis    Component Value Date/Time   COLORURINE STRAW (A) 12/18/2022 0036   APPEARANCEUR CLEAR 12/18/2022 0036   LABSPEC 1.004 (L) 12/18/2022 0036   PHURINE 7.0 12/18/2022 0036   GLUCOSEU NEGATIVE 12/18/2022 0036   HGBUR NEGATIVE 12/18/2022 0036   BILIRUBINUR NEGATIVE 12/18/2022 0036   BILIRUBINUR neg 01/08/2017 1123   KETONESUR NEGATIVE 12/18/2022 0036   PROTEINUR  NEGATIVE 12/18/2022 0036   UROBILINOGEN 0.2 01/08/2017 1123   UROBILINOGEN 0.2 09/09/2014 1926   NITRITE NEGATIVE 12/18/2022 0036   LEUKOCYTESUR NEGATIVE 12/18/2022 0036   Sepsis Labs Recent Labs  Lab 12/18/22 0004 12/19/22 0130  WBC 6.3 6.6   Microbiology Recent Results (from the past 240 hour(s))  MRSA Next Gen by PCR, Nasal     Status: None   Collection Time: 12/18/22  3:56 PM   Specimen: Nasal Mucosa; Nasal Swab  Result Value Ref Range Status   MRSA by PCR Next Gen NOT DETECTED NOT DETECTED Final    Comment: (NOTE) The GeneXpert MRSA Assay (FDA approved for NASAL specimens only), is one component of a comprehensive MRSA colonization surveillance program. It is not intended to diagnose MRSA infection nor to guide or monitor treatment for MRSA infections. Test performance is not FDA approved in patients less than 52 years old. Performed at [facility], [address] [place], [place] [address]      Time coordinating discharge: 25 minutes  SIGNED: Lanae Boast, MD  [facility] 12/20/2022, 1:59 PM  If 7PM-7AM, please contact night-coverage [email or web address]

## 2022-12-20 NOTE — Evaluation (Signed)
Occupational Therapy Evaluation Patient Details Name: Bradley Mcgrath MRN: [id number] DOB: 19-Feb-1981 Today's Date: 12/20/2022   History of Present Illness Pt is a 42 y.o. male who presented 12/17/22 with R hip pain x2 weeks. Noted to have R hip dislocation. Unsuccessful attempt was made to reduce the hip in the ED. S/p closed reduction of R hip dislocation 5/8. Noted hx of dislocations of the R hip with planned closed reduction but pt leaving AMA prior to going to operating room. PMH: schizophrenia, HTN, depression, CVA   Clinical Impression   PTA, pt living at [facility], sleeps on a mat on the floor, and has access to a handicap accessible shower. Per pt, he was independent PTA. Upon eval, pt performing UB ADL with set-up and LB ADL with supervision and up to min cues for techniques. Pt with decreased recall of precautions and requiring cues to maintain throughout. Practiced floor level transfers with chair to aid in lowering himself to the ground within his precautions due to this being where he sleeps. Will continue to follow acutely to reinforce precautions but do not suspect need for follow up therapies after discharge.      Recommendations for follow up therapy are one component of a multi-disciplinary discharge planning process, led by the attending physician.  Recommendations may be updated based on patient status, additional functional criteria and insurance authorization.   Assistance Recommended at Discharge PRN  Patient can return home with the following Help with stairs or ramp for entrance;Assist for transportation    Functional Status Assessment  Patient has had a recent decline in their functional status and demonstrates the ability to make significant improvements in function in a reasonable and predictable amount of time.  Equipment Recommendations  None recommended by OT (reviewed and provided necessary AE for dressing due to homelessness status)    Recommendations for Other Services        Precautions / Restrictions Precautions Precautions: Fall;Posterior Hip Precaution Booklet Issued: Yes (comment) Precaution Comments: reviewed posterior hip precautions, but pt needs further education due to poor carryover in tasks Required Braces or Orthoses: Knee Immobilizer - Right Knee Immobilizer - Right: On at all times;Other (comment) (except when showering) Restrictions Weight Bearing Restrictions: Yes RLE Weight Bearing: Weight bearing as tolerated      Mobility Bed Mobility Overal bed mobility: Modified Independent             General bed mobility comments: HOB elevated, no assistance needed but did educate pt to exit R side of bed to encourage hip abduction instead of adduction. Noted pt's ankles were crossed in bed upon arrival, educating him to avoid this position.    Transfers Overall transfer level: Needs assistance Equipment used: None, Crutches Transfers: Sit to/from Stand Sit to Stand: Supervision           General transfer comment: Able to come to stand multiple times from EOB and from chair without armrests without LOB or assistance, supervision for safety. Pt often keeping R foot off the floor even though encouraged he could place weight on it as tolerated. Demonstrated transfer chair <> floor via lowering self forward with arms on seat and lifting self backwards back up onto chair with arms on seat surface. Pt demonstrated understanding through performing this transfer with min guard assist for safety. Educated pt to push back of chair against wall to ensure it does not slide out from under him with transfers to/from the floor to/from his bed mat at home.  Balance Overall balance assessment: Mild deficits observed, not formally tested                                         ADL either performed or assessed with clinical judgement   ADL Overall ADL's : Needs assistance/impaired Eating/Feeding: Independent   Grooming:  Supervision/safety;Standing   Upper Body Bathing: Set up;Sitting   Lower Body Bathing: Supervison/ safety;Sit to/from stand Lower Body Bathing Details (indicate cue type and reason): educated regaring compensatory techniques and long handled equipment Upper Body Dressing : Set up;Sitting   Lower Body Dressing: Supervision/safety;Sit to/from stand;With adaptive equipment Lower Body Dressing Details (indicate cue type and reason): educated and pt demonstrating performance with AE. Toilet Transfer: Supervision/safety;Ambulation   Toileting- Clothing Manipulation and Hygiene: Set up;Sit to/from stand   Tub/ Shower Transfer: Walk-in shower;Supervision/safety;Ambulation   Functional mobility during ADLs: Supervision/safety General ADL Comments: Pt has been sleeping on mat on floor. Education regarding floor level transers within precautions with PT/OT during session.     Vision Patient Visual Report: No change from baseline Vision Assessment?: No apparent visual deficits Additional Comments: Able to read hip precautions handout     Perception     Praxis      Pertinent Vitals/Pain Pain Assessment Pain Assessment: Faces Faces Pain Scale: Hurts a little bit Pain Location: R leg Pain Descriptors / Indicators: Discomfort, Operative site guarding     Hand Dominance Left   Extremity/Trunk Assessment Upper Extremity Assessment Upper Extremity Assessment: Overall WFL for tasks assessed   Lower Extremity Assessment Lower Extremity Assessment: RLE deficits/detail RLE Deficits / Details: reports hx of achilles tendon issues, noted pt maintains ankle plantarflexion position even when encouraged to try to get foot flat when ambulating; knee in KI and posterior hip precautions currently   Cervical / Trunk Assessment Cervical / Trunk Assessment: Normal   Communication Communication Communication: No difficulties   Cognition Arousal/Alertness: Awake/alert Behavior During Therapy:  Impulsive (mildly) Overall Cognitive Status: Within Functional Limits for tasks assessed                                 General Comments: Pt with poor carryover of education on hip precautions when performing functional tasks, needing reminders and education of why he cannot do certain things a specific way at this time. Likely baseline though. Inconsistent report of occupational status.     General Comments  encouraged R ankle dorsiflexion ROM exercises/stretches    Exercises     Shoulder Instructions      Home Living Family/patient expects to be discharged to:: Shelter/Homeless (at the [facility] [place])                             Home Equipment: Crutches   Additional Comments: Lives at the [facility], which has a level entry in the front and is 1-level inside. There are handicap and standard height toilets and walk-in showers that are handicap accessible with grab bars and shower chairs as needed. Pt sleeps on a mat on the floor.      Prior Functioning/Environment Prior Level of Function : Independent/Modified Independent             Mobility Comments: Intermittently utilized crutches ADLs Comments: Reports he works "with his mind" but very unclear in describing his potential job.  Reports he rides a bike in the community        OT Problem List: Decreased strength;Decreased activity tolerance;Impaired balance (sitting and/or standing);Decreased knowledge of use of DME or AE;Decreased knowledge of precautions;Decreased safety awareness      OT Treatment/Interventions: Self-care/ADL training;Therapeutic exercise;Balance training;Patient/family education;Therapeutic activities;DME and/or AE instruction    OT Goals(Current goals can be found in the care plan section) Acute Rehab OT Goals Patient Stated Goal: get better OT Goal Formulation: With patient Time For Goal Achievement: 01/03/23 Potential to Achieve Goals: Good  OT Frequency: Min 2X/week     Co-evaluation PT/OT/SLP Co-Evaluation/Treatment: Yes Reason for Co-Treatment: Other (comment) (unsure of pt's compliance to participate) PT goals addressed during session: Mobility/safety with mobility;Balance;Proper use of DME OT goals addressed during session: ADL's and self-care      AM-PAC OT "6 Clicks" Daily Activity     Outcome Measure Help from another person eating meals?: None Help from another person taking care of personal grooming?: A Little Help from another person toileting, which includes using toliet, bedpan, or urinal?: A Little Help from another person bathing (including washing, rinsing, drying)?: A Little Help from another person to put on and taking off regular upper body clothing?: A Little Help from another person to put on and taking off regular lower body clothing?: A Little 6 Click Score: 19   End of Session Equipment Utilized During Treatment: Gait belt;Right knee immobilizer Nurse Communication: Mobility status  Activity Tolerance: Patient tolerated treatment well Patient left: Other (comment) (with PT)  OT Visit Diagnosis: Unsteadiness on feet (R26.81);Muscle weakness (generalized) (M62.81);Other abnormalities of gait and mobility (R26.89)                Time: 0930-1000 OT Time Calculation (min): 30 min Charges:  OT General Charges $OT Visit: 1 Visit OT Evaluation $OT Eval Low Complexity: 1 Low  Bradley Mcgrath, Bradley Mcgrath [facility] Acute Rehabilitation Office: [phone]   Bradley Mcgrath 12/20/2022, 11:49 AM

## 2022-12-20 NOTE — Evaluation (Signed)
Physical Therapy Evaluation Patient Details Name: Bradley Mcgrath MRN: [id number] DOB: October 05, 1980 Today's Date: 12/20/2022  History of Present Illness  Pt is a 42 y.o. male who presented 12/17/22 with R hip pain x2 weeks. Noted to have R hip dislocation. Unsuccessful attempt was made to reduce the hip in the ED. S/p closed reduction of R hip dislocation 5/8. Noted hx of dislocations of the R hip with planned closed reduction but pt leaving AMA prior to going to operating room. PMH: schizophrenia, HTN, depression, CVA   Clinical Impression  Pt presents with condition above and deficits mentioned below, see PT Problem List. PTA, he was independent, intermittently using crutches due to his hx of R hip dislocation. He reports he resides at [facility], which is 1-level with a level entry. Pt sleeps on a mat on the floor. Currently, pt demonstrates deficits in balance, R ankle dorsiflexion ROM (chronic per pt), R lower extremity weight bearing tolerance, and functional mobility. Pt also needs repeated reminders to comply with his R posterior hip precautions during functional tasks. Pt was able to perform all functional mobility, including a floor transfer (as demonstrated and planned) and stair negotiation, with crutches without LOB or physical assistance. Pt will likely progress well, but could benefit from further acute PT services to re-enforce his precautions and improve his gait pattern.       Recommendations for follow up therapy are one component of a multi-disciplinary discharge planning process, led by the attending physician.  Recommendations may be updated based on patient status, additional functional criteria and insurance authorization.  Follow Up Recommendations       Assistance Recommended at Discharge PRN  Patient can return home with the following  Assist for transportation;Assistance with cooking/housework    Equipment Recommendations None recommended by PT  Recommendations for Other  Services       Functional Status Assessment Patient has had a recent decline in their functional status and demonstrates the ability to make significant improvements in function in a reasonable and predictable amount of time.     Precautions / Restrictions Precautions Precautions: Fall;Posterior Hip Precaution Booklet Issued: Yes (comment) Precaution Comments: reviewed posterior hip precautions, but pt needs further education due to poor carryover in tasks Required Braces or Orthoses: Knee Immobilizer - Right Knee Immobilizer - Right: On at all times;Other (comment) (except when showering) Restrictions Weight Bearing Restrictions: Yes RLE Weight Bearing: Weight bearing as tolerated      Mobility  Bed Mobility Overal bed mobility: Modified Independent             General bed mobility comments: HOB elevated, no assistance needed but did educate pt to exit R side of bed to encourage hip abduction instead of adduction. Noted pt's ankles were crossed in bed upon arrival, educating him to avoid this position.    Transfers Overall transfer level: Needs assistance Equipment used: None, Crutches Transfers: Sit to/from Stand Sit to Stand: Supervision           General transfer comment: Able to come to stand multiple times from EOB and from chair without armrests without LOB or assistance, supervision for safety. Pt often keeping R foot off the floor even though encouraged he could place weight on it as tolerated. Demonstrated transfer chair <> floor via lowering self forward with arms on seat and lifting self backwards back up onto chair with arms on seat surface. Pt demonstrated understanding through performing this transfer with min guard assist for safety. Educated pt to push back  of chair against wall to ensure it does not slide out from under him with transfers to/from the floor to/from his bed mat at home.    Ambulation/Gait Ambulation/Gait assistance: Supervision Gait  Distance (Feet): 180 Feet (x2 bouts of ~5 ft > ~180 ft) Assistive device: None, Crutches Gait Pattern/deviations: Decreased stance time - right, Decreased dorsiflexion - right, Decreased stride length, Step-to pattern, Step-through pattern (swing-to/swing-through) Gait velocity: WFL Gait velocity interpretation: >2.62 ft/sec, indicative of community ambulatory   General Gait Details: Pt initially keeping R foot off the ground when using crutches, needing encouragement to try to place weight on it, progressing from swing-to/swing-through to step-to and eventually step-through gait pattern. Pt maintains ankle plantarflexion on the R, which pt reports is chronic. No LOB, supervision for safety  Stairs Stairs: Yes Stairs assistance: Min guard Stair Management: No rails, Step to pattern, Forwards, With crutches Number of Stairs: 6 General stair comments: Ascends and descends forward with crutches, leading up with L foot and down with R foot, no LOB, min guard for safety  Wheelchair Mobility    Modified Rankin (Stroke Patients Only)       Balance Overall balance assessment: Mild deficits observed, not formally tested                                           Pertinent Vitals/Pain Pain Assessment Pain Assessment: Faces Faces Pain Scale: Hurts a little bit Pain Location: R leg Pain Descriptors / Indicators: Discomfort, Operative site guarding Pain Intervention(s): Limited activity within patient's tolerance, Monitored during session, Repositioned    Home Living Family/patient expects to be discharged to:: Shelter/Homeless (at the [facility])                 Home Equipment: Crutches Additional Comments: Lives at the [facility], which has a level entry in the front and is 1-level inside. There are handicap and standard height toilets and walk-in showers that are handicap accessible with grab bars and shower chairs as needed. Pt sleeps on a mat on the floor.    Prior Function  Prior Level of Function : Independent/Modified Independent             Mobility Comments: Intermittently utilized crutches ADLs Comments: Reports he works "with his mind" but very unclear in describing his potential job. Reports he rides a bike in the community     Hand Dominance   Dominant Hand: Left    Extremity/Trunk Assessment   Upper Extremity Assessment Upper Extremity Assessment: Defer to OT evaluation    Lower Extremity Assessment Lower Extremity Assessment: RLE deficits/detail RLE Deficits / Details: reports hx of achilles tendon issues, noted pt maintains ankle plantarflexion position even when encouraged to try to get foot flat when ambulating; knee in KI and posterior hip precautions currently    Cervical / Trunk Assessment Cervical / Trunk Assessment: Normal  Communication   Communication: No difficulties  Cognition Arousal/Alertness: Awake/alert Behavior During Therapy: Impulsive (mildly) Overall Cognitive Status: Within Functional Limits for tasks assessed                                 General Comments: Pt with poor carryover of education on hip precautions when performing functional tasks, needing reminders and education of why he cannot do certain things a specific way at this time. Likely baseline though.  General Comments General comments (skin integrity, edema, etc.): encouraged R ankle dorsiflexion ROM exercises/stretches    Exercises     Assessment/Plan    PT Assessment Patient needs continued PT services  PT Problem List Decreased balance;Decreased mobility       PT Treatment Interventions DME instruction;Gait training;Functional mobility training;Stair training;Therapeutic activities;Therapeutic exercise;Balance training;Neuromuscular re-education;Patient/family education    PT Goals (Current goals can be found in the Care Plan section)  Acute Rehab PT Goals Patient Stated Goal: to not dislocate his hip again PT  Goal Formulation: With patient Time For Goal Achievement: 01/03/23 Potential to Achieve Goals: Good    Frequency Min 3X/week     Co-evaluation PT/OT/SLP Co-Evaluation/Treatment: Yes Reason for Co-Treatment: Other (comment) (unsure of pt's compliance to participate) PT goals addressed during session: Mobility/safety with mobility;Balance;Proper use of DME         AM-PAC PT "6 Clicks" Mobility  Outcome Measure Help needed turning from your back to your side while in a flat bed without using bedrails?: None Help needed moving from lying on your back to sitting on the side of a flat bed without using bedrails?: None Help needed moving to and from a bed to a chair (including a wheelchair)?: A Little Help needed standing up from a chair using your arms (e.g., wheelchair or bedside chair)?: A Little Help needed to walk in hospital room?: A Little Help needed climbing 3-5 steps with a railing? : A Little 6 Click Score: 20    End of Session Equipment Utilized During Treatment: Gait belt Activity Tolerance: Patient tolerated treatment well Patient left: Other (comment) (in bathroom with NT reporting she has the pt and will assist him) Nurse Communication: Mobility status (NT) PT Visit Diagnosis: Unsteadiness on feet (R26.81);Other abnormalities of gait and mobility (R26.89)    Time: 0930-1000 PT Time Calculation (min) (ACUTE ONLY): 30 min   Charges:   PT Evaluation $PT Eval Low Complexity: 1 Low          Raymond Gurney, PT, DPT Acute Rehabilitation Services  Office: [phone]   Jewel Baize 12/20/2022, 10:27 AM

## 2022-12-20 NOTE — Progress Notes (Signed)
[name] to be D/C'd Home per MD order.  Discussed with the patient and all questions fully answered.  VSS, Skin clean, dry and intact without evidence of skin break down, no evidence of skin tears noted. IV catheter discontinued intact. Site without signs and symptoms of complications. Dressing and pressure applied.  An After Visit Summary was printed and given to the patient. Patient received prescriptions from [facility] pharmacy. All patient belongings returned to patient from ED locker #2. Bus pass was given to patient. Patient leaving with crutches as well.  D/c education completed with patient/family including follow up instructions, medication list, d/c activities limitations if indicated, with other d/c instructions as indicated by MD - patient able to verbalize understanding, all questions fully answered.   Patient instructed to return to ED, call 911, or call MD for any changes in condition.   Patient escorted via WC, and D/C home via public tansportation.  Pauletta Browns 12/20/2022 5:34 PM
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
## 2023-02-06 ENCOUNTER — Emergency Department (HOSPITAL_COMMUNITY): Payer: Medicaid Other

## 2023-02-06 ENCOUNTER — Observation Stay (HOSPITAL_COMMUNITY)
Admission: EM | Admit: 2023-02-06 | Discharge: 2023-02-07 | Disposition: A | Discharge status: Home / Self Care | Payer: Medicaid Other | Attending: Orthopedic Surgery | Admitting: Orthopedic Surgery

## 2023-02-06 ENCOUNTER — Other Ambulatory Visit: Payer: Self-pay

## 2023-02-06 ENCOUNTER — Encounter (HOSPITAL_COMMUNITY): Payer: Self-pay

## 2023-02-06 ENCOUNTER — Encounter (HOSPITAL_COMMUNITY)
Admission: EM | Disposition: A | Discharge status: Home / Self Care | Payer: Self-pay | Source: Home / Self Care | Attending: Emergency Medicine

## 2023-02-06 ENCOUNTER — Emergency Department (HOSPITAL_BASED_OUTPATIENT_CLINIC_OR_DEPARTMENT_OTHER): Payer: Medicaid Other

## 2023-02-06 DIAGNOSIS — D649 Anemia, unspecified: Secondary | ICD-10-CM | POA: Diagnosis not present

## 2023-02-06 DIAGNOSIS — M25551 Pain in right hip: Secondary | ICD-10-CM | POA: Diagnosis present

## 2023-02-06 DIAGNOSIS — F17210 Nicotine dependence, cigarettes, uncomplicated: Secondary | ICD-10-CM

## 2023-02-06 DIAGNOSIS — Z8673 Personal history of transient ischemic attack (TIA), and cerebral infarction without residual deficits: Secondary | ICD-10-CM | POA: Insufficient documentation

## 2023-02-06 DIAGNOSIS — I10 Essential (primary) hypertension: Secondary | ICD-10-CM

## 2023-02-06 DIAGNOSIS — Z79899 Other long term (current) drug therapy: Secondary | ICD-10-CM | POA: Diagnosis not present

## 2023-02-06 DIAGNOSIS — S73004A Unspecified dislocation of right hip, initial encounter: Secondary | ICD-10-CM | POA: Diagnosis present

## 2023-02-06 DIAGNOSIS — T84020A Dislocation of internal right hip prosthesis, initial encounter: Secondary | ICD-10-CM | POA: Diagnosis not present

## 2023-02-06 DIAGNOSIS — Y828 Other medical devices associated with adverse incidents: Secondary | ICD-10-CM | POA: Diagnosis not present

## 2023-02-06 HISTORY — PX: HIP CLOSED REDUCTION: SHX983

## 2023-02-06 LAB — NO BLOOD PRODUCTS

## 2023-02-06 SURGERY — CLOSED REDUCTION, HIP
Anesthesia: General | Site: Hip | Laterality: Right

## 2023-02-06 MED ORDER — ONDANSETRON HCL 4 MG/2ML IJ SOLN
INTRAMUSCULAR | Status: AC
  Filled 2023-02-06: qty 2

## 2023-02-06 MED ORDER — HYDROMORPHONE HCL 1 MG/ML IJ SOLN
INTRAMUSCULAR | Status: AC
  Filled 2023-02-06: qty 1

## 2023-02-06 MED ORDER — AMISULPRIDE (ANTIEMETIC) 5 MG/2ML IV SOLN: 10.0000 mg | Freq: Once | INTRAVENOUS | Status: DC | PRN

## 2023-02-06 MED ORDER — PROPOFOL 10 MG/ML IV BOLUS
INTRAVENOUS | Status: DC | PRN
  Administered 2023-02-06: 20 mg via INTRAVENOUS
  Administered 2023-02-06: 180 mg via INTRAVENOUS

## 2023-02-06 MED ORDER — OXYCODONE HCL 5 MG PO TABS: 5.0000 mg | ORAL_TABLET | Freq: Once | ORAL | Status: DC | PRN

## 2023-02-06 MED ORDER — FENTANYL CITRATE (PF) 250 MCG/5ML IJ SOLN
INTRAMUSCULAR | Status: AC
  Filled 2023-02-06: qty 5

## 2023-02-06 MED ORDER — DEXAMETHASONE SODIUM PHOSPHATE 10 MG/ML IJ SOLN
INTRAMUSCULAR | Status: AC
  Filled 2023-02-06: qty 1

## 2023-02-06 MED ORDER — METHOCARBAMOL 500 MG PO TABS
500.0000 mg | ORAL_TABLET | Freq: Four times a day (QID) | ORAL | Status: DC | PRN
  Administered 2023-02-06 – 2023-02-07 (×3): 500 mg via ORAL
  Filled 2023-02-06 (×4): qty 1

## 2023-02-06 MED ORDER — CHLORHEXIDINE GLUCONATE 4 % EX SOLN: 60.0000 mL | Freq: Once | CUTANEOUS | Status: DC

## 2023-02-06 MED ORDER — LACTATED RINGERS IV SOLN: INTRAVENOUS | Status: DC

## 2023-02-06 MED ORDER — POLYETHYLENE GLYCOL 3350 17 G PO PACK: 17.0000 g | PACK | Freq: Every day | ORAL | Status: DC | PRN

## 2023-02-06 MED ORDER — ONDANSETRON HCL 4 MG/2ML IJ SOLN
INTRAMUSCULAR | Status: DC | PRN
  Administered 2023-02-06: 4 mg via INTRAVENOUS

## 2023-02-06 MED ORDER — LIDOCAINE 2% (20 MG/ML) 5 ML SYRINGE
INTRAMUSCULAR | Status: DC | PRN
  Administered 2023-02-06: 100 mg via INTRAVENOUS

## 2023-02-06 MED ORDER — SUCCINYLCHOLINE CHLORIDE 200 MG/10ML IV SOSY
PREFILLED_SYRINGE | INTRAVENOUS | Status: DC | PRN
  Administered 2023-02-06: 120 mg via INTRAVENOUS

## 2023-02-06 MED ORDER — ONDANSETRON HCL 4 MG PO TABS: 4.0000 mg | ORAL_TABLET | Freq: Four times a day (QID) | ORAL | Status: DC | PRN

## 2023-02-06 MED ORDER — DIPHENHYDRAMINE HCL 12.5 MG/5ML PO ELIX: 12.5000 mg | ORAL_SOLUTION | ORAL | Status: DC | PRN

## 2023-02-06 MED ORDER — CHLORHEXIDINE GLUCONATE 0.12 % MT SOLN: 15.0000 mL | OROMUCOSAL | Status: DC

## 2023-02-06 MED ORDER — ACETAMINOPHEN 500 MG PO TABS
500.0000 mg | ORAL_TABLET | Freq: Four times a day (QID) | ORAL | Status: DC
  Administered 2023-02-06 – 2023-02-07 (×3): 500 mg via ORAL
  Filled 2023-02-06 (×3): qty 1

## 2023-02-06 MED ORDER — FENTANYL CITRATE PF 50 MCG/ML IJ SOSY
100.0000 ug | PREFILLED_SYRINGE | Freq: Once | INTRAMUSCULAR | Status: AC
  Administered 2023-02-06: 100 ug via INTRAVENOUS
  Filled 2023-02-06: qty 2

## 2023-02-06 MED ORDER — POVIDONE-IODINE 10 % EX SWAB: 2.0000 | Freq: Once | CUTANEOUS | Status: DC

## 2023-02-06 MED ORDER — RISPERIDONE 2 MG PO TABS
2.0000 mg | ORAL_TABLET | Freq: Every day | ORAL | Status: DC
  Administered 2023-02-06: 2 mg via ORAL
  Filled 2023-02-06: qty 1

## 2023-02-06 MED ORDER — DULOXETINE HCL 20 MG PO CPEP
20.0000 mg | ORAL_CAPSULE | Freq: Every day | ORAL | Status: DC
  Administered 2023-02-07: 20 mg via ORAL
  Filled 2023-02-06: qty 1

## 2023-02-06 MED ORDER — FENTANYL CITRATE (PF) 250 MCG/5ML IJ SOLN
INTRAMUSCULAR | Status: DC | PRN
  Administered 2023-02-06: 100 ug via INTRAVENOUS

## 2023-02-06 MED ORDER — HYDROMORPHONE HCL 1 MG/ML IJ SOLN
0.2500 mg | INTRAMUSCULAR | Status: DC | PRN
  Administered 2023-02-06 (×2): 0.5 mg via INTRAVENOUS

## 2023-02-06 MED ORDER — ACETAMINOPHEN 10 MG/ML IV SOLN
1000.0000 mg | Freq: Once | INTRAVENOUS | Status: DC | PRN
  Administered 2023-02-06: 1000 mg via INTRAVENOUS

## 2023-02-06 MED ORDER — METHOCARBAMOL 1000 MG/10ML IJ SOLN: 500.0000 mg | Freq: Four times a day (QID) | INTRAVENOUS | Status: DC | PRN

## 2023-02-06 MED ORDER — PROPOFOL 10 MG/ML IV BOLUS
INTRAVENOUS | Status: AC | PRN
  Administered 2023-02-06 (×3): 40 mg via INTRAVENOUS

## 2023-02-06 MED ORDER — LIDOCAINE 2% (20 MG/ML) 5 ML SYRINGE
INTRAMUSCULAR | Status: AC
  Filled 2023-02-06: qty 5

## 2023-02-06 MED ORDER — ONDANSETRON HCL 4 MG/2ML IJ SOLN: 4.0000 mg | Freq: Four times a day (QID) | INTRAMUSCULAR | Status: DC | PRN

## 2023-02-06 MED ORDER — ONDANSETRON HCL 4 MG/2ML IJ SOLN: 4.0000 mg | Freq: Once | INTRAMUSCULAR | Status: DC | PRN

## 2023-02-06 MED ORDER — ACETAMINOPHEN 325 MG PO TABS: 325.0000 mg | ORAL_TABLET | Freq: Four times a day (QID) | ORAL | Status: DC | PRN

## 2023-02-06 MED ORDER — PROPOFOL 10 MG/ML IV BOLUS
1.0000 mg/kg | Freq: Once | INTRAVENOUS | Status: AC
  Administered 2023-02-06: 78 mg via INTRAVENOUS
  Filled 2023-02-06: qty 20

## 2023-02-06 MED ORDER — BUPRENORPHINE HCL-NALOXONE HCL 8-2 MG SL SUBL
1.0000 | SUBLINGUAL_TABLET | Freq: Every day | SUBLINGUAL | Status: DC | PRN
  Administered 2023-02-06 – 2023-02-07 (×2): 1 via SUBLINGUAL
  Filled 2023-02-06 (×2): qty 1

## 2023-02-06 MED ORDER — PANTOPRAZOLE SODIUM 40 MG PO TBEC: 40.0000 mg | DELAYED_RELEASE_TABLET | Freq: Every day | ORAL | Status: DC | PRN

## 2023-02-06 MED ORDER — MIDAZOLAM HCL 2 MG/2ML IJ SOLN
INTRAMUSCULAR | Status: AC
  Filled 2023-02-06: qty 2

## 2023-02-06 MED ORDER — DOCUSATE SODIUM 100 MG PO CAPS: 100.0000 mg | ORAL_CAPSULE | Freq: Two times a day (BID) | ORAL | Status: DC | PRN

## 2023-02-06 MED ORDER — SENNA 8.6 MG PO TABS: 1.0000 | ORAL_TABLET | Freq: Two times a day (BID) | ORAL | Status: DC | PRN

## 2023-02-06 MED ORDER — PHENOL 1.4 % MT LIQD: 1.0000 | OROMUCOSAL | Status: DC | PRN

## 2023-02-06 MED ORDER — MENTHOL 3 MG MT LOZG: 1.0000 | LOZENGE | OROMUCOSAL | Status: DC | PRN

## 2023-02-06 MED ORDER — GABAPENTIN 400 MG PO CAPS
400.0000 mg | ORAL_CAPSULE | Freq: Three times a day (TID) | ORAL | Status: DC | PRN
  Administered 2023-02-06 – 2023-02-07 (×2): 400 mg via ORAL
  Filled 2023-02-06 (×2): qty 1

## 2023-02-06 MED ORDER — ACETAMINOPHEN 10 MG/ML IV SOLN
INTRAVENOUS | Status: AC
  Filled 2023-02-06: qty 100

## 2023-02-06 MED ORDER — DEXAMETHASONE SODIUM PHOSPHATE 10 MG/ML IJ SOLN
INTRAMUSCULAR | Status: DC | PRN
  Administered 2023-02-06: 10 mg via INTRAVENOUS

## 2023-02-06 MED ORDER — OXYCODONE HCL 5 MG/5ML PO SOLN: 5.0000 mg | Freq: Once | ORAL | Status: DC | PRN

## 2023-02-06 MED ORDER — PROPOFOL 10 MG/ML IV BOLUS
INTRAVENOUS | Status: AC
  Filled 2023-02-06: qty 20

## 2023-02-06 MED ORDER — MIDAZOLAM HCL 2 MG/2ML IJ SOLN
INTRAMUSCULAR | Status: DC | PRN
  Administered 2023-02-06: 2 mg via INTRAVENOUS

## 2023-02-06 SURGICAL SUPPLY — 1 items: IMMOBILIZER KNEE 22 UNIV (SOFTGOODS) IMPLANT

## 2023-02-06 NOTE — Anesthesia Postprocedure Evaluation (Signed)
Anesthesia Post Note  Patient: OCIE TINO  Procedure(s) Performed: CLOSED REDUCTION HIP (Right: Hip)     Patient location during evaluation: PACU Anesthesia Type: General Level of consciousness: awake and alert Pain management: pain level controlled Vital Signs Assessment: post-procedure vital signs reviewed and stable Respiratory status: spontaneous breathing, nonlabored ventilation, respiratory function stable and patient connected to nasal cannula oxygen Cardiovascular status: blood pressure returned to baseline and stable Postop Assessment: no apparent nausea or vomiting Anesthetic complications: no   No notable events documented.  Last Vitals:  Vitals:   02/06/23 1445 02/06/23 1502  BP: 115/62 128/86  Pulse: 65 63  Resp: 10   Temp: 36.7 C (!) 36.4 C  SpO2: 95% 100%    Last Pain:  Vitals:   02/06/23 1502  TempSrc: Oral  PainSc:                  Trevor Iha
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
## 2023-02-06 NOTE — Op Note (Signed)
02/06/2023  1:34 PM  PATIENT:  Bradley Mcgrath    PRE-OPERATIVE DIAGNOSIS: Closed dislocation of right hip arthroplasty prosthesis  POST-OPERATIVE DIAGNOSIS:  Same  PROCEDURE: Right closed hip reduction under general anesthesia  SURGEON:  Gladis Soley A Lorien Shingler, MD  PHYSICIAN ASSISTANT: none   ANESTHESIA:   General  PREOPERATIVE INDICATIONS:  [name] is a  42 y.o. male history of right hip arthroplasty done many years ago elsewhere.  He has had multiple dislocations of his right hip.  Most recently had to be closed reduced by Dr. Jena Gauss about 2 months ago.  He reports sustaining another dislocation today.  Difficulty with weightbearing and mobility of the right hip.  X-rays in ER demonstrated posterior dislocation.  Unsuccessful attempt at reduction in the ER recommended close reduction to be attempted in the operating room.  The risks benefits and alternatives were discussed with the patient preoperatively including but not limited to the risks of infection, bleeding, nerve injury, cardiopulmonary complications, the need for revision surgery, among others, and the patient was willing to proceed.  ESTIMATED BLOOD LOSS: 0cc  OPERATIVE IMPLANTS: none  OPERATIVE FINDINGS: Successful closed reduction under general anesthesia of right hip prosthesis  OPERATIVE PROCEDURE:  The patient was identified in the preoperative holding area. Consent was confirmed with the patient and all questions were answered. The operative extremity was marked after confirmation with the patient. he was then brought back to the operating room by our anesthesia colleagues  He was placed under general anesthetic on the stretcher.  A timeout was performed to verify the patient, the procedure, and the extremity.  The hip was then flexed, adducted, internally rotated and traction was applied, while the assistant held countertraction on the pelvis.  A palpable clunk was felt when the hip reduced.  The leg was brought into  extension and felt the leg lengths were equal.  Flatplate x-ray confirmed concentric reduction of the hip arthroplasty without any adverse features.  The patient was then awoken from anesthesia and taken to the PACU in stable condition.  A knee immobilizer was placed to his right lower extremity to prevent flexion of his hip.   Post Op Plan/Instructions: Post op recs: WB: WBAT, posterior hip precautions with knee immobilizer Abx: not indicated Dressing: none DVT prophylaxis: not indicated Follow up: 1-2 weeks after surgery with Dr. Blanchie Dessert at [facility].  Address: [address], [place], [place] [address]  Office Phone: [phone]
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
## 2023-02-06 NOTE — Interval H&P Note (Signed)
The patient has been re-examined, and the chart reviewed, and there have been no interval changes to the documented history and physical.    Plan for closed reduction right prosthetic hip dislocation under anesthesia.  The operative side was examined and the patient was confirmed to have sensation to DPN, SPN, TN intact, Motor EHL, ext, flex 5/5, and DP 2+, PT 2+, No significant edema.   The risks, benefits, and alternatives have been discussed at length with patient, and the patient is willing to proceed.  Right hip marked. Consent has been signed.

## 2023-02-06 NOTE — Sedation Documentation (Signed)
Plan to xray with follow up in OR if needed.

## 2023-02-06 NOTE — ED Provider Notes (Signed)
[place] EMERGENCY DEPARTMENT AT [facility] Provider Note   CSN: [id number] Arrival date & time: 02/06/23  4098     History  Chief Complaint  Patient presents with   Hip Pain    Bradley Mcgrath is a 42 y.o. male.   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  Reportedly has had dislocation for 3 to 4 days per the patient.  However he told nurse that it was fine last night.  Is on Suboxone and took some this morning.  Does have history of schizophrenia.  Reviewed notes and just under a month ago had to have the hip reduced in the OR.  Has reportedly had the hip replaced in [facility] although had not followed up.    Past Medical History:  Diagnosis Date   Depression    Hypertension    Kidney stones    Schizophrenia (HCC)    Stroke (HCC)     Home Medications Prior to Admission medications   Medication Sig Start Date End Date Taking? Authorizing Provider  Buprenorphine HCl-Naloxone HCl 8-2 MG FILM Place 1 Film under the tongue in the morning, at noon, and at bedtime. 12/05/22   [provider]  DULoxetine (CYMBALTA) 20 MG capsule Take 1 capsule (20 mg total) by mouth daily. 12/21/22   Starkes-Perry, Juel Burrow, FNP  gabapentin (NEURONTIN) 800 MG tablet Take 800 mg by mouth 3 (three) times daily as needed (nerve pain).    [provider]  naloxone [facility]) nasal spray 4 mg/0.1 mL Place 0.4 mg into the nose once. 12/06/22   [provider]  risperiDONE (RISPERDAL) 2 MG tablet Take 1 tablet (2 mg total) by mouth at bedtime. 12/20/22   Maryagnes Chiem, FNP      Allergies    Ketamine    Review of Systems   Review of Systems  Physical Exam Updated Vital Signs BP 128/86   Pulse 63   Temp (!) 97.5 F (36.4 C) (Oral)   Resp 10   Ht 6' (1.829 m)   Wt 78 kg   SpO2 100%   BMI 23.33 kg/m  Physical Exam Vitals reviewed.  Abdominal:     Tenderness: There is no abdominal tenderness.  Musculoskeletal:     Cervical back:  Neck supple.     Comments: Somewhat decreased range of motion right hip.  Held in extension.  Skin:    General: Skin is warm.  Neurological:     Mental Status: He is alert.     ED Results / Procedures / Treatments   Labs (all labs ordered are listed, but only abnormal results are displayed) Labs Reviewed  NO BLOOD PRODUCTS    EKG None  Radiology DG HIP UNILAT WITH PELVIS 1V RIGHT  Result Date: 02/06/2023 CLINICAL DATA:  Elective surgery, status post closed reduction of right hip in OR. EXAM: DG HIP (WITH OR WITHOUT PELVIS) 1V RIGHT COMPARISON:  Radiograph earlier today FINDINGS: Interval reduction of previous arthroplasty dislocation. The femoral component is seated in the acetabular component. No acute fracture is seen. Distal aspect of the femoral stem not entirely included in the field of view. IMPRESSION: Interval reduction of previous arthroplasty dislocation. No acute fracture. Electronically Signed   By: Narda Rutherford M.D.   On: 02/06/2023 14:53   DG Hip Port [place] W or Wo Pelvis 1 View Right  Result Date: 02/06/2023 CLINICAL DATA:  Hip reduction EXAM: DG HIP (WITH OR WITHOUT PELVIS) 1V PORT RIGHT COMPARISON:  Right hip radiograph dated February 06, 2019 FINDINGS: Postreduction films of the right hip demonstrate persistent right hip arthroplasty dislocation. Hardware is intact. No evidence of fracture. Chronic osseous deformity of the inferior right pubic ramus. IMPRESSION: Persistent right hip arthroplasty dislocation. Electronically Signed   By: Allegra Lai M.D.   On: 02/06/2023 11:05   DG HIP UNILAT WITH PELVIS 1V RIGHT  Result Date: 02/06/2023 CLINICAL DATA:  Pain, fall, concern for hip dislocation EXAM: DG HIP (WITH OR WITHOUT PELVIS) 1V RIGHT COMPARISON:  12/19/2022 FINDINGS: Right hip femoral arthroplasty component is dislocated in relation to the acetabular cup. Bony pelvis intact. Healed deformity of the right inferior ramus. Left hip unremarkable. Nonobstructive  bowel gas pattern. SI joints are maintained. No diastasis. IMPRESSION: Right hip arthroplasty dislocation. Electronically Signed   By: Judie Petit.  Shick M.D.   On: 02/06/2023 09:01    Procedures .Sedation  Date/Time: 02/06/2023 10:25 AM  Performed by: Benjiman Core, MD Authorized by: Benjiman Core, MD   Consent:    Consent obtained:  Verbal   Consent given by:  Patient   Risks discussed:  Allergic reaction, dysrhythmia, inadequate sedation, nausea, respiratory compromise necessitating ventilatory assistance and intubation, vomiting and prolonged hypoxia resulting in organ damage   Alternatives discussed:  Analgesia without sedation Universal protocol:    Immediately prior to procedure, a time out was called: yes     Patient identity confirmed:  Arm band and verbally with patient Indications:    Procedure performed:  Dislocation reduction   Procedure necessitating sedation performed by:  Physician performing sedation Pre-sedation assessment:    Time since last food or drink:  6   ASA classification: class 1 - normal, healthy patient     Mouth opening:  2 finger widths   Thyromental distance:  4 finger widths   Mallampati score:  I - soft palate, uvula, fauces, pillars visible   Neck mobility: normal     Pre-sedation assessments completed and reviewed: airway patency, cardiovascular function, hydration status, mental status, pain level, respiratory function and temperature     Pre-sedation assessments completed and reviewed: pre-procedure nausea and vomiting status not reviewed   Immediate pre-procedure details:    Reassessment: Patient reassessed immediately prior to procedure     Reviewed: vital signs     Verified: bag valve mask available, emergency equipment available, intubation equipment available, IV patency confirmed, oxygen available and suction available   Procedure details (see MAR for exact dosages):    Preoxygenation:  Nasal cannula   Sedation:  Etomidate   Intended  level of sedation: deep   Analgesia:  Fentanyl   Intra-procedure monitoring:  Blood pressure monitoring, cardiac monitor, continuous pulse oximetry, frequent vital sign checks and frequent LOC assessments   Intra-procedure events: none     Total Provider sedation time (minutes):  10 Post-procedure details:    Post-sedation assessment completed:  02/06/2023 11:24 AM   Attendance: Constant attendance by certified staff until patient recovered     Recovery: Patient returned to pre-procedure baseline     Post-sedation assessments completed and reviewed: airway patency, cardiovascular function, hydration status, mental status, pain level, respiratory function and temperature     Post-sedation assessments completed and reviewed: post-procedure nausea and vomiting status not reviewed     Patient is stable for discharge or admission: yes     Procedure completion:  Tolerated well, no immediate complications Comments:     Unsuccessful reduction.  Had been given 100 of fentanyl and 200 of propofol.  Medications Ordered in ED Medications  chlorhexidine (PERIDEX) 0.12 % solution 15 mL (has no administration in time range)  lactated ringers infusion ( Intravenous Anesthesia Volume Adjustment 02/06/23 1335)  buprenorphine-naloxone (SUBOXONE) 8-2 mg per SL tablet 1 tablet (1 tablet Sublingual Given 02/06/23 1611)  DULoxetine (CYMBALTA) DR capsule 20 mg (has no administration in time range)  risperiDONE (RISPERDAL) tablet 2 mg (has no administration in time range)  gabapentin (NEURONTIN) capsule 400 mg (has no administration in time range)  lactated ringers infusion ( Intravenous Not Given 02/06/23 1614)  methocarbamol (ROBAXIN) tablet 500 mg (500 mg Oral Given 02/06/23 1611)    Or  methocarbamol (ROBAXIN) 500 mg in dextrose 5 % 50 mL IVPB ( Intravenous See Alternative 02/06/23 1611)  diphenhydrAMINE (BENADRYL) 12.5 MG/5ML elixir 12.5-25 mg (has no administration in time range)  docusate sodium (COLACE)  capsule 100 mg (has no administration in time range)  senna (SENOKOT) tablet 8.6 mg (has no administration in time range)  polyethylene glycol (MIRALAX / GLYCOLAX) packet 17 g (has no administration in time range)  pantoprazole (PROTONIX) EC tablet 40 mg (has no administration in time range)  menthol-cetylpyridinium (CEPACOL) lozenge 3 mg (has no administration in time range)    Or  phenol (CHLORASEPTIC) mouth spray 1 spray (has no administration in time range)  acetaminophen (TYLENOL) tablet 325-650 mg (has no administration in time range)  acetaminophen (TYLENOL) tablet 500 mg (500 mg Oral Given 02/06/23 1610)  ondansetron (ZOFRAN) tablet 4 mg (has no administration in time range)    Or  ondansetron (ZOFRAN) injection 4 mg (has no administration in time range)  HYDROmorphone (DILAUDID) 1 MG/ML injection (has no administration in time range)  acetaminophen (OFIRMEV) 10 MG/ML IV (has no administration in time range)  propofol (DIPRIVAN) 10 mg/mL bolus/IV push 78 mg (78 mg Intravenous Given 02/06/23 1026)  fentaNYL (SUBLIMAZE) injection 100 mcg (100 mcg Intravenous Given 02/06/23 1009)  propofol (DIPRIVAN) 10 mg/mL bolus/IV push (40 mg Intravenous Given 02/06/23 1032)  fentaNYL (SUBLIMAZE) injection 100 mcg (100 mcg Intravenous Given 02/06/23 1202)    ED Course/ Medical Decision Making/ A&P                             Medical Decision Making Amount and/or Complexity of Data Reviewed Radiology: ordered.  Risk Prescription drug management.   Patient with prosthetic hip dislocation.  History of same.  Reviewed x-ray and does show dislocation on my interpretation.  Has not eaten this morning.  However has had some issues with getting sedation in the past and being able to get it down.  Potentially related to Suboxone.  Reviewed previous notes both of the recent admission and previous ER visits.  Attempted reduction of dislocated hip prosthesis.  Assisted by Earney Hamburg from orthopedic  surgery.  Unable to get reduced.  Will likely need OR.  Had been given 200 of propofol and 100 of fentanyl.  Patient is n.p.o. since 4 in the morning.        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  If you develope a fever above  101 F, pus (white drainage) or increased drainage or redness at the wound, or calf pain, call your surgeon's office.   02/06/23 1336    Diet - low sodium heart healthy        02/06/23 1336    Constipation Prevention       Comments: Drink plenty of fluids.  Prune juice may be helpful.  You may use a stool softener, such as Colace (over the counter) 100 mg twice a day.  Use MiraLax (over the counter) for constipation as needed.   02/06/23 1336    Increase activity slowly as tolerated        02/06/23 1336    Call MD / Call 911       Comments: If you experience chest pain or shortness of breath, CALL 911 and be transported to the hospital emergency room.  If you develope a fever above 101 F, pus (white drainage) or increased drainage or redness at the wound, or calf pain, call your surgeon's office.   Pending    Post-operative opioid taper instructions:       Comments: POST-OPERATIVE OPIOID TAPER INSTRUCTIONS: It is important to wean off of your opioid medication as soon as possible. If you do not need pain medication after your surgery it is ok to stop day one. Opioids include: Codeine, Hydrocodone(Norco, Vicodin), Oxycodone(Percocet, oxycontin) and hydromorphone amongst others.  Long term and even short term use of opiods can cause: Increased pain response Dependence Constipation Depression Respiratory depression And more.  Withdrawal symptoms can include Flu like  symptoms Nausea, vomiting And more Techniques to manage these symptoms Hydrate well Eat regular healthy meals Stay active Use relaxation techniques(deep breathing, meditating, yoga) Do Not substitute Alcohol to help with tapering If you have been on opioids for less than two weeks and do not have pain than it is ok to stop all together.  Plan to wean off of opioids This plan should start within one week post op of your joint replacement. Maintain the same interval or time between taking each dose and first decrease the dose.  Cut the total daily intake of opioids by one tablet each day Next start to increase the time between doses. The last dose that should be eliminated is the evening dose.      Pending    Diet - low sodium heart healthy        Pending    Constipation Prevention       Comments: Drink plenty of fluids.  Prune juice may be helpful.  You may use a stool softener, such as Colace (over the counter) 100 mg twice a day.  Use MiraLax (over the counter) for constipation as needed.   Pending    Increase activity slowly as tolerated        Pending    Driving restrictions       Comments: No driving for 2 weeks   Pending    Do not sit on low chairs, stoools or toilet seats, as it may be difficult to get up from low surfaces        Pending    acetaminophen (TYLENOL) 500 MG tablet  Every 8 hours PRN        Pending              Benjiman Core, MD 02/06/23 1623

## 2023-02-06 NOTE — Anesthesia Procedure Notes (Signed)
Procedure Name: Intubation Date/Time: 02/06/2023 1:20 PM  Performed by: Darlina Guys, CRNAPre-anesthesia Checklist: Patient identified, Emergency Drugs available, Suction available and Patient being monitored Patient Re-evaluated:Patient Re-evaluated prior to induction Oxygen Delivery Method: Circle system utilized Preoxygenation: Pre-oxygenation with 100% oxygen Induction Type: IV induction Laryngoscope Size: Mac and 4 Grade View: Grade II Tube type: Oral Tube size: 7.0 mm Number of attempts: 2 Placement Confirmation: ETT inserted through vocal cords under direct vision, positive ETCO2 and breath sounds checked- equal and bilateral Secured at: 23 cm Tube secured with: Tape Comments: SRNA DL x 1, could not get good view. MDA DL x 1 and able to successfully intubate. MDA noted grade 2b view

## 2023-02-06 NOTE — Procedures (Signed)
Procedure: Right hip closed reduction   Indication: Right hip dislocation   Surgeon: Charma Igo, PA-C   Assist: None   Anesthesia: Propofol via EDP   EBL: None   Complications: Unsuccessful   Findings: After risks/benefits explained patient desires to undergo procedure. Consent obtained and time out performed. Sedation given and reduction attempted unsuccessfully. Pt tolerated the procedure fairly.       Freeman Caldron, PA-C Orthopedic Surgery [phone]

## 2023-02-06 NOTE — ED Triage Notes (Signed)
Patient comes in for right hip dislocation, has history of same. Patient went to sleep last night with no problems and woke up with it out of place, reports he believes someone may be messing with him while he sleeps. 7/10 pain to right hip. Takes suboxone and took 8mg  upon arrival. VSS, NAD.

## 2023-02-06 NOTE — Transfer of Care (Signed)
Immediate Anesthesia Transfer of Care Note  Patient: KADENCE MIMBS  Procedure(s) Performed: CLOSED REDUCTION HIP (Right: Hip)  Patient Location: PACU  Anesthesia Type:General  Level of Consciousness: awake and patient cooperative  Airway & Oxygen Therapy: Patient Spontanous Breathing and Patient connected to face mask oxygen  Post-op Assessment: Report given to RN and Post -op Vital signs reviewed and stable  Post vital signs: Reviewed and stable  Last Vitals:  Vitals Value Taken Time  BP 137/85 02/06/23 1337  Temp 36.7 C 02/06/23 1337  Pulse 71 02/06/23 1337  Resp 15 02/06/23 1339  SpO2 100 % 02/06/23 1337  Vitals shown include unvalidated device data.  Last Pain:  Vitals:   02/06/23 1133  TempSrc:   PainSc: 6          Complications: No notable events documented.

## 2023-02-06 NOTE — Anesthesia Preprocedure Evaluation (Signed)
Anesthesia Evaluation  Patient identified by MRN, date of birth, ID band Patient awake    Reviewed: Allergy & Precautions, NPO status , Patient's Chart, lab work & pertinent test results  Airway Mallampati: II  TM Distance: >3 FB Neck ROM: Full    Dental  (+) Chipped,    Pulmonary Current Smoker and Patient abstained from smoking.   Pulmonary exam normal        Cardiovascular hypertension, negative cardio ROS Normal cardiovascular exam     Neuro/Psych  PSYCHIATRIC DISORDERS  Depression  Schizophrenia  CVA    GI/Hepatic negative GI ROS,,,(+)     substance abuse    Endo/Other  negative endocrine ROS    Renal/GU Renal diseaseLab Results      Component                Value               Date                      CREATININE               0.72                12/19/2022                BUN                      13                  12/19/2022                NA                       133 (L)             12/19/2022                K                        3.7                 12/19/2022                     Musculoskeletal negative musculoskeletal ROS (+)  narcotic dependentR hip dislocation   Abdominal   Peds  Hematology  (+) Blood dyscrasia, anemia , REFUSES BLOOD PRODUCTSLab Results      Component                Value               Date                      CREATININE               0.72                12/19/2022                          K                        3.7                 12/19/2022  Anesthesia Other Findings right hip dislocation  Reproductive/Obstetrics                              Anesthesia Physical Anesthesia Plan  ASA: 3  Anesthesia Plan: General   Post-op Pain Management:    Induction: Intravenous  PONV Risk Score and Plan: 1 and Ondansetron, Dexamethasone, Midazolam and Treatment may vary due to age or medical condition  Airway Management Planned: Oral  ETT  Additional Equipment: None  Intra-op Plan:   Post-operative Plan: Extubation in OR  Informed Consent: I have reviewed the patients History and Physical, chart, labs and discussed the procedure including the risks, benefits and alternatives for the proposed anesthesia with the patient or authorized representative who has indicated his/her understanding and acceptance.     Dental advisory given  Plan Discussed with: CRNA  Anesthesia Plan Comments:         Anesthesia Quick Evaluation

## 2023-02-06 NOTE — ED Notes (Signed)
Assisted patient with urinal patient has call bell in reach

## 2023-02-06 NOTE — Sedation Documentation (Signed)
Xray at bedside, patient verbally verified name and DOB.

## 2023-02-06 NOTE — Evaluation (Signed)
Physical Therapy Evaluation Patient Details Name: Bradley Mcgrath MRN: [id number] DOB: 1981-02-11 Today's Date: 02/06/2023  History of Present Illness  Pt is a 42 y.o. male who presented 12/17/22 with R hip pain x2 weeks. Noted to have R hip dislocation. Unsuccessful attempt was made to reduce the hip in the ED. S/p closed reduction of R hip dislocation 5/8. Noted hx of dislocations of the R hip with planned closed reduction but pt leaving AMA prior to going to operating room. PMH: schizophrenia, HTN, depression, CVA   Clinical Impression  Bradley Mcgrath is a 42 y.o. male POD 0 s/p closed Rt hip reduction under general anesthesia. Patient reports independence with use of crutches occasionally for mobility and bike for long distance transportation. Patient is now limited by functional impairments (see PT problem list below) and requires Min guard for transfers and gait with RW. Patient was able to ambulate ~100 feet with RW and min guard/assist. Patient will benefit from continued skilled PT interventions to address impairments and progress towards PLOF. Acute PT will follow to progress mobility as able.        Recommendations for follow up therapy are one component of a multi-disciplinary discharge planning process, led by the attending physician.  Recommendations may be updated based on patient status, additional functional criteria and insurance authorization.  Follow Up Recommendations Can patient physically be transported by private vehicle: Yes (however pt does not have access to vehicle)     Assistance Recommended at Discharge Intermittent Supervision/Assistance  Patient can return home with the following  A little help with bathing/dressing/bathroom;Assistance with cooking/housework;Direct supervision/assist for medications management;Assist for transportation;Help with stairs or ramp for entrance;A little help with walking and/or transfers    Equipment Recommendations Rolling walker (2  wheels);Crutches (TBD)  Recommendations for Other Services       Functional Status Assessment Patient has had a recent decline in their functional status and demonstrates the ability to make significant improvements in function in a reasonable and predictable amount of time.     Precautions / Restrictions Precautions Precautions: Fall;Posterior Hip Required Braces or Orthoses: Knee Immobilizer - Right Restrictions Weight Bearing Restrictions: No      Mobility  Bed Mobility Overal bed mobility: Needs Assistance Bed Mobility: Supine to Sit, Sit to Supine     Supine to sit: Modified independent (Device/Increase time) Sit to supine: Modified independent (Device/Increase time)   General bed mobility comments: HOB elevated, pt required cues for hip precautions    Transfers Overall transfer level: Needs assistance Equipment used: Rolling walker (2 wheels) Transfers: Sit to/from Stand Sit to Stand: Min guard           General transfer comment: cues for hip precautions, guarding for safety    Ambulation/Gait Ambulation/Gait assistance: Min guard Gait Distance (Feet): 100 Feet Assistive device: Rolling walker (2 wheels) Gait Pattern/deviations: Step-through pattern, Decreased weight shift to right, Antalgic Gait velocity: fair     General Gait Details: cues for safe velocity and to keep walker on ground as pt lifting walker while advancing. no LOB noted. pt preferring TDWB on Rt LE and reports due to Rt foot sensitivity.  Stairs            Wheelchair Mobility    Modified Rankin (Stroke Patients Only)       Balance Overall balance assessment: Needs assistance Sitting-balance support: Feet supported, Bilateral upper extremity supported, No upper extremity supported Sitting balance-Leahy Scale: Good     Standing balance support: During functional activity, Reliant  on assistive device for balance, Bilateral upper extremity supported Standing balance-Leahy  Scale: Fair                               Pertinent Vitals/Pain      Home Living Family/patient expects to be discharged to::  (at [facility])                   Additional Comments: was living at [facility], which has a level entry in the front and is 1-level inside. There are handicap and standard height toilets and walk-in showers that are handicap accessible with grab bars and shower chairs as needed. Pt sleeps on a mat on the floor. he uses a bike for transportation.    Prior Function Prior Level of Function : Independent/Modified Independent             Mobility Comments: Intermittently utilized crutches       Hand Dominance   Dominant Hand: Left    Extremity/Trunk Assessment   Upper Extremity Assessment Upper Extremity Assessment: Defer to OT evaluation;Overall WFL for tasks assessed    Lower Extremity Assessment Lower Extremity Assessment: Overall WFL for tasks assessed;RLE deficits/detail RLE Deficits / Details: reports Rt LE shortened compared to Lt RLE: Unable to fully assess due to immobilization RLE Sensation:  (reports hypersensitivity to Rt foot) RLE Coordination: decreased gross motor;decreased fine motor    Cervical / Trunk Assessment Cervical / Trunk Assessment: Normal  Communication   Communication: No difficulties  Cognition Arousal/Alertness: Awake/alert Behavior During Therapy: WFL for tasks assessed/performed Overall Cognitive Status: Impaired/Different from baseline (suspect baseline) Area of Impairment: Safety/judgement, Awareness, Following commands                       Following Commands: Follows multi-step commands inconsistently Safety/Judgement: Decreased awareness of safety Awareness: Emergent   General Comments: pt with slightly impaired safety awareness and slightly impulsive. suspect pt is at baseline.        General Comments      Exercises     Assessment/Plan    PT Assessment Patient needs  continued PT services  PT Problem List Decreased strength;Decreased range of motion;Decreased activity tolerance;Decreased balance;Decreased mobility;Decreased cognition;Decreased knowledge of use of DME;Decreased safety awareness;Decreased knowledge of precautions;Impaired sensation;Pain       PT Treatment Interventions DME instruction;Gait training;Stair training;Functional mobility training;Therapeutic activities;Therapeutic exercise;Balance training;Neuromuscular re-education;Cognitive remediation;Patient/family education;Wheelchair mobility training    PT Goals (Current goals can be found in the Care Plan section)  Acute Rehab PT Goals Patient Stated Goal: be able to ride bike for transportation PT Goal Formulation: With patient Time For Goal Achievement: 02/20/23 Potential to Achieve Goals: Good    Frequency Min 5X/week     Co-evaluation               AM-PAC PT "6 Clicks" Mobility  Outcome Measure Help needed turning from your back to your side while in a flat bed without using bedrails?: A Little Help needed moving from lying on your back to sitting on the side of a flat bed without using bedrails?: A Little Help needed moving to and from a bed to a chair (including a wheelchair)?: A Little Help needed standing up from a chair using your arms (e.g., wheelchair or bedside chair)?: A Little Help needed to walk in hospital room?: A Little Help needed climbing 3-5 steps with a railing? : A Lot 6 Click Score: 17  End of Session Equipment Utilized During Treatment: Gait belt;Right knee immobilizer Activity Tolerance: Patient tolerated treatment well Patient left: in bed;with call bell/phone within reach;with bed alarm set Nurse Communication: Mobility status;Patient requests pain meds PT Visit Diagnosis: Muscle weakness (generalized) (M62.81);Difficulty in walking, not elsewhere classified (R26.2);Pain;Unsteadiness on feet (R26.81);Other abnormalities of gait and mobility  (R26.89) Pain - Right/Left: Right Pain - part of body: Hip;Leg    Time: 1550-1603 PT Time Calculation (min) (ACUTE ONLY): 13 min   Charges:   PT Evaluation $PT Eval Low Complexity: 1 Low         Wynn Maudlin, DPT Acute Rehabilitation Services Office [phone]  02/06/23 4:24 PM

## 2023-02-06 NOTE — ED Notes (Signed)
Got patient on the monitor patient is resting with call bell in reach and nurse and doctor at bedside

## 2023-02-07 ENCOUNTER — Encounter (HOSPITAL_COMMUNITY): Payer: Self-pay | Admitting: Orthopedic Surgery

## 2023-02-07 MED ORDER — ACETAMINOPHEN 500 MG PO TABS: 500.0000 mg | ORAL_TABLET | Freq: Three times a day (TID) | ORAL | 0 refills | Status: AC | PRN

## 2023-02-07 NOTE — TOC Initial Note (Addendum)
Transition of Care [facility]) - Initial/Assessment Note    Patient Details  Name: Bradley Mcgrath MRN: [id number] Date of Birth: 1980/10/17  [facility]) CM/SW Contact:    Lorri Frederick, LCSW Phone Number: 02/07/2023, 9:39 AM  Clinical Narrative:     CSW met with pt for initial assessment.  Pt homeless, stays outside or sometimes at [facility].  Plans to return to this situation.  Does not want SNF rehab.  Pt is familiar with resources at [facility], eats at [employer] or at [employer].  Pt reports he came to the hospital on his bike and is planning to leave on the bike--he left it unlocked outside the ED.  Discussed PT follow up, pt said it is out of his way to get to an outpt PT office, "riding a bike is PT"  Pt also does not want walker.  Pt only asking that CSW check if bike is still outside ED.           [phone]: Bike is still outside ED in the bike rack, bags/belongings also there.  CSW spoke with security, they will keep an eye on the bike until pt arrives to reclaim it. RN informed, who will tell pt.   Expected Discharge Plan: Home/Self Care Barriers to Discharge: No Barriers Identified   Patient Goals and CMS Choice            Expected Discharge Plan and Services     Post Acute Care Choice: Resumption of Svcs/PTA Provider Living arrangements for the past 2 months: Homeless Expected Discharge Date: 02/06/23                                    Prior Living Arrangements/Services Living arrangements for the past 2 months: Homeless Lives with:: Self Patient language and need for interpreter reviewed:: Yes Do you feel safe going back to the place where you live?: Yes      Need for Family Participation in Patient Care: No (Comment) Care giver support system in place?: No (comment) Current home services: Other (comment) (none) Criminal Activity/Legal Involvement Pertinent to Current Situation/Hospitalization: No - Comment as needed  Activities of Daily Living       Permission Sought/Granted                  Emotional Assessment Appearance:: Appears older than stated age Attitude/Demeanor/Rapport: Engaged Affect (typically observed): Appropriate, Pleasant Orientation: : Oriented to Self, Oriented to Place, Oriented to  Time, Oriented to Situation      Admission diagnosis:  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) [T84.020A] Closed dislocation of right hip (HCC) [S73.004A] Patient Active Problem List   Diagnosis Date Noted   Closed dislocation of right hip (HCC) 02/06/2023   Psychosis (HCC) 12/18/2022   Closed posterior dislocation of right hip (HCC) 12/18/2022   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   Dislocated hip (HCC) 12/11/2022   Paranasal sinus disease 12/11/2022   Marijuana dependence (HCC) 05/27/2020   Psychoses (HCC) 05/27/2020   Alcohol abuse 05/27/2020   Polysubstance abuse (HCC) 05/27/2020   Opiate use 05/27/2020   Nicotine dependence 05/27/2020   Schizophrenia (HCC) 05/24/2020   Cellulitis of right leg 01/10/2016   Hypokalemia 01/10/2016   Abnormal LFTs 07/13/2014   Gall stones 07/13/2014   CN (constipation) 07/13/2014   Pain of upper abdomen 07/13/2014   PCP:  Pcp, No Pharmacy:  [id number] - [place]), [place] - [address] [place] ([place] [address] Phone: [phone] Fax: [phone]  [facility] - [place], [place] - [address] [place] [address] Phone: [phone] Fax: [phone]  [id number] - [place], [place] - [address] AT [facility] [address] [facility] [address] [place] [address] Phone: [phone] Fax: [phone]  [facility] [address] [place] [address] Phone: [phone] Fax: [phone]     Social Determinants of Health (SDOH) Social History: SDOH  Screenings   Food Insecurity: Food Insecurity Present (12/18/2022)  Housing: Medium Risk (12/18/2022)  Transportation Needs: Unmet Transportation Needs (12/18/2022)  Utilities: Not At Risk (12/18/2022)  Alcohol Screen: Medium Risk (05/25/2020)  Tobacco Use: High Risk (02/07/2023)   SDOH Interventions:     Readmission Risk Interventions     No data to display

## 2023-02-07 NOTE — Discharge Summary (Signed)
Physician Discharge Summary  Patient ID: Bradley Mcgrath MRN: [id number] DOB/AGE: [birth year] 42 y.o.  Admit date: 02/06/2023 Discharge date: 02/07/2023  Admission Diagnoses:  Closed dislocation of right hip [facility])  Discharge Diagnoses:  Principal Problem:   Closed dislocation of right hip [facility])   Past Medical History:  Diagnosis Date   Depression    Hypertension    Kidney stones    Schizophrenia (HCC)    Stroke [facility])     Surgeries: Procedure(s): CLOSED REDUCTION HIP on 02/06/2023   Consultants (if any):   Discharged Condition: Improved  Hospital Course: Bradley Mcgrath is an 42 y.o. male who was admitted 02/06/2023 with a diagnosis of Closed dislocation of right hip (HCC) and went to the operating room on 02/06/2023 and underwent the above named procedures.  History of homelessness.  Patient has chronic recurrent dislocations and continues to get lost to follow up.  He was given perioperative antibiotics:  Anti-infectives (From admission, onward)    None      DVT prophylaxis was not indicated.  He benefited maximally from the hospital stay and there were no complications.    Recent vital signs:  Vitals:   02/07/23 0424 02/07/23 0816  BP: (!) 111/54 121/64  Pulse: (!) 57 71  Resp: 14 18  Temp: (!) 97.5 F (36.4 C) 98 F (36.7 C)  SpO2: 99% 100%    Recent laboratory studies:  Lab Results  Component Value Date   HGB 12.2 (L) 12/19/2022   HGB 12.2 (L) 12/18/2022   HGB 12.0 (L) 12/10/2022   Lab Results  Component Value Date   WBC 6.6 12/19/2022   PLT 289 12/19/2022   Lab Results  Component Value Date   INR 1.1 12/18/2022   Lab Results  Component Value Date   NA 133 (L) 12/19/2022   K 3.7 12/19/2022   CL 99 12/19/2022   CO2 27 12/19/2022   BUN 13 12/19/2022   CREATININE 0.72 12/19/2022   GLUCOSE 116 (H) 12/19/2022    Discharge Medications:   Allergies as of 02/07/2023       Reactions   Ketamine Other (See Comments)   Pt states "it killed me". Dose  used at home, not under medical care.        Medication List     TAKE these medications    acetaminophen 500 MG tablet Commonly known as: TYLENOL Take 1 tablet (500 mg total) by mouth every 8 (eight) hours as needed.   Buprenorphine HCl-Naloxone HCl 8-2 MG Film Place 1 Film under the tongue in the morning, at noon, and at bedtime.   DULoxetine 20 MG capsule Commonly known as: CYMBALTA Take 1 capsule (20 mg total) by mouth daily.   gabapentin 800 MG tablet Commonly known as: NEURONTIN Take 800 mg by mouth 3 (three) times daily as needed (nerve pain).   naloxone 4 MG/0.1ML Liqd nasal spray kit Commonly known as: NARCAN Place 0.4 mg into the nose once.   risperiDONE 2 MG tablet Commonly known as: RISPERDAL Take 1 tablet (2 mg total) by mouth at bedtime.        Diagnostic Studies: DG HIP UNILAT WITH PELVIS 1V RIGHT  Result Date: 02/06/2023 CLINICAL DATA:  Elective surgery, status post closed reduction of right hip in OR. EXAM: DG HIP (WITH OR WITHOUT PELVIS) 1V RIGHT COMPARISON:  Radiograph earlier today FINDINGS: Interval reduction of previous arthroplasty dislocation. The femoral component is seated in the acetabular component. No acute fracture is seen. Distal aspect of the  femoral stem not entirely included in the field of view. IMPRESSION: Interval reduction of previous arthroplasty dislocation. No acute fracture. Electronically Signed   By: Narda Rutherford M.D.   On: 02/06/2023 14:53   DG Hip [place] or [place] Pelvis 1 View Right  Result Date: 02/06/2023 CLINICAL DATA:  Hip reduction EXAM: DG HIP (WITH OR WITHOUT PELVIS) 1V PORT RIGHT COMPARISON:  Right hip radiograph dated February 06, 2019 FINDINGS: Postreduction films of the right hip demonstrate persistent right hip arthroplasty dislocation. Hardware is intact. No evidence of fracture. Chronic osseous deformity of the inferior right pubic ramus. IMPRESSION: Persistent right hip arthroplasty dislocation. Electronically  Signed   By: Allegra Lai M.D.   On: 02/06/2023 11:05   DG HIP UNILAT WITH PELVIS 1V RIGHT  Result Date: 02/06/2023 CLINICAL DATA:  Pain, fall, concern for hip dislocation EXAM: DG HIP (WITH OR WITHOUT PELVIS) 1V RIGHT COMPARISON:  12/19/2022 FINDINGS: Right hip femoral arthroplasty component is dislocated in relation to the acetabular cup. Bony pelvis intact. Healed deformity of the right inferior ramus. Left hip unremarkable. Nonobstructive bowel gas pattern. SI joints are maintained. No diastasis. IMPRESSION: Right hip arthroplasty dislocation. Electronically Signed   By: Judie Petit.  Shick M.D.   On: 02/06/2023 09:01    Disposition: Discharge disposition: 01-Home or Self Care       Discharge Instructions     Call MD / Call 911   Complete by: As directed    If you experience chest pain or shortness of breath, CALL 911 and be transported to the hospital emergency room.  If you develope a fever above 101 F, pus (white drainage) or increased drainage or redness at the wound, or calf pain, call your surgeon's office.   Call MD / Call 911   Complete by: As directed    If you experience chest pain or shortness of breath, CALL 911 and be transported to the hospital emergency room.  If you develope a fever above 101 F, pus (white drainage) or increased drainage or redness at the wound, or calf pain, call your surgeon's office.   Constipation Prevention   Complete by: As directed    Drink plenty of fluids.  Prune juice may be helpful.  You may use a stool softener, such as Colace (over the counter) 100 mg twice a day.  Use MiraLax (over the counter) for constipation as needed.   Constipation Prevention   Complete by: As directed    Drink plenty of fluids.  Prune juice may be helpful.  You may use a stool softener, such as Colace (over the counter) 100 mg twice a day.  Use MiraLax (over the counter) for constipation as needed.   Diet - low sodium heart healthy   Complete by: As directed    Diet -  low sodium heart healthy   Complete by: As directed    Do not sit on low chairs, stoools or toilet seats, as it may be difficult to get up from low surfaces   Complete by: As directed    Driving restrictions   Complete by: As directed    No driving for 2 weeks   Increase activity slowly as tolerated   Complete by: As directed    Increase activity slowly as tolerated   Complete by: As directed         Follow-up Information     Joen Laura, MD Follow up in 1 week(s).   Specialty: Orthopedic Surgery Contact information: [address]  [address] [place] [address] [phone]                  Discharge Instructions   Closed Reduction for Prosthetic Hip Joint Dislocation, Care After This sheet gives you information about how to care for yourself after your procedure. Your health care provider may also give you more specific instructions. If you have problems or questions, contact your health care provider. What can I expect after the procedure? After the procedure, it is common to have: Hip pain for about a week. The pain should lessen each day. It may take a few weeks to recover completely. Trouble walking or doing your usual daily activities. You may need to use crutches for a period of time. Follow these instructions at home: Medicines Take over-the-counter and prescription medicines only as told by your health care provider. Ask your health care provider if the medicine prescribed to you: Requires you to avoid driving or using machinery. Can cause constipation. You may need to take these actions to prevent or treat constipation: Drink enough fluid to keep your urine pale yellow. Take over-the-counter or prescription medicines. Eat foods that are high in fiber, such as beans, whole grains, and fresh fruits and vegetables. Limit foods that are high in fat and processed sugars, such as fried or sweet foods. If you have a brace: Wear the brace as told by  your health care provider. Remove it only as told by your health care provider. Loosen the brace if your toes tingle, become numb, or turn cold and blue. Keep the brace clean and dry. Bathing If the brace is not waterproof: Do not let it get wet. Cover it with a watertight covering when you take a bath or shower. Managing pain, stiffness, and swelling  If directed, put ice on the affected area. To do this: If you have a removable brace, remove it as told by your health care provider. Put ice in a plastic bag. Place a towel between your skin and the bag or between your brace and the bag. Leave the ice on for 20 minutes, 2-3 times a day. Remove the ice if your skin turns bright red. This is very important. If you cannot feel pain, heat, or cold, you have a greater risk of damage to the area. Move your toes often to reduce stiffness and swelling. Raise (elevate) your leg above the level of your heart while you are lying down. Activity Ask your health care provider what activities are safe for you. Stop any activity that causes pain or discomfort. Ask for help with activities that are difficult. Do not use your injured leg to support (bear) your body weight until your health care provider says that you can. Follow instructions about how much weight you may safely support on your affected leg (weight-bearing restrictions). Use crutches as told by your health care provider. When you stop using your crutches, walk and move slowly until you feel stable. Do not lift anything that is heavier than 10 lb (4.5 kg), or the limit that you are told, until your health care provider says that it is safe. Do exercises as told by your health care provider. Movement restrictions  Follow all hip dislocation precautions in any position (standing, sitting, or lying) as told by your health care provider. These precautions may include the following: Do not cross your legs at the knees. To remind yourself about this,  you may keep a pillow between your legs while lying in bed.  Do not bend forward at the waist (to avoid hip flexion of more then 90 degrees). To avoid bending this far: Do not bring your knees higher than the level of your hips. Do not pick up something from the floor while sitting in a chair. Avoid sitting in low chairs. Use a raised toilet seat When standing up from a seated position, keep the injured leg out in front of you. Do not bend down to a squat position while in the shower. You may need someone to help you while you shower. Avoid twisting at your waist and reaching across your body to the side of the affected leg. Avoid rotating the toes of your affected leg inward. When getting into a car: Raise the seat as high as possible, move the seat as far back as it will go, and recline the upper part of the seat slightly. Sit down into the seat with your injured leg extended out of the car. Scoot back in the seat as you move the lower half of your body into the car. Try to avoid bumping your foot or leg as you bring it into the car. General instructions Ask your health care provider when it is safe for you to drive. Do not use any products that contain nicotine or tobacco, such as cigarettes, e-cigarettes, and chewing tobacco. These can delay healing. If you need help quitting, ask your health care provider. Keep all follow-up visits. This is important. Contact a health care provider if: Walking or moving is not getting easier for you. Your calf swells or feels tender. You have swelling that gets worse or is severe. Any part of your hip or leg feels numb, tingles, burns, or stings. You have pain that does not get better with medicine. Your brace is damaged or has gotten wet and is not waterproof. Get help right away if: You think you have dislocated your hip again. You cannot move your leg. You have trouble breathing. You have chest pain. These symptoms may represent a serious problem  that is an emergency. Do not wait to see if the symptoms will go away. Get medical help right away. Call your local emergency services (911 in the [place] Do not drive yourself to the hospital. Summary Do not use your injured leg to support your body weight until your health care provider says that you can. Follow instructions about how much weight you may safely support on your affected leg (weight-bearing restrictions). Use crutches as told by your health care provider. When you stop using your crutches, move slowly at first. Keep all follow-up visits. This is important. This information is not intended to replace advice given to you by your health care provider. Make sure you discuss any questions you have with your health care provider.    [facility] [employer] [facility] The [employer]'s "B3979455" is a great source of information about community services available.  Access by dialing 2-1-1 from anywhere in [place], or by website -  PooledIncome.pl.    Other Local Resources (Updated 11/2015)   Shelters   Services    Phone Number and Address  [facility] Housing for homeless and needy men with substance abuse issues [phone] [address] [place], [place]  [employer] Emergency assistance [employer] Pantry services [phone] [place], [place]  [facility] Domestic violence shelter for women and their children [phone] [place], [place]  [facility] Domestic violence shelter for women and their children ([phone] [place], [place]  Loss adjuster, chartered [facility]) / Resources for the [employer] center for the homeless Information and referral to housing resources Counseling [employer] Phone bank Mailroom Computer lab Medical clinic Bike maintenance center [phone] [address] [place], [place]  [facility] [employer] [facility]  Emergency  housing Food Emergency financial assistance Permanent supportive housing [phone] [address] [place], [place]   [employer]  Crisis assistance Medication Housing Food Utility assistance [phone] [address] [place], [place]   [phone] [address] [place], [place]  [employer] of [place]       Transitional housing Case Chartered certified accountant assistance [phone] [address] [place], [place]  Lysle Morales, [employer] for adult men and women [phone] [address] [place], [place]  24-hour Crisis Line for those Facing Homelessness   Information and referral to community resources [phone]         Signed: Cecil Cobbs 02/07/2023, 4:59 PM

## 2023-02-07 NOTE — Plan of Care (Signed)
AVS Reviewed.  Patient anxious for DC. Patient denied DME and any placement.  Wanted to collect his bike that is parked at the ED and go home

## 2023-02-07 NOTE — Progress Notes (Signed)
     Subjective:  POD1 closed reduction right hip.  Patient reports pain as mildly sore.  Slept well.  Denies chest pain, shortness of breath, N/V, numbness, or tingling.  Objective:   VITALS:   Vitals:   02/06/23 1502 02/06/23 2115 02/06/23 2338 02/07/23 0424  BP: 128/86 115/65 107/63 (!) 111/54  Pulse: 63 73 66 (!) 57  Resp:  15 15 14   Temp: (!) 97.5 F (36.4 C) 97.8 F (36.6 C) 98 F (36.7 C) (!) 97.5 F (36.4 C)  TempSrc: Oral Oral Oral Oral  SpO2: 100% 97% 97% 99%  Weight:      Height:        AAOx4, sitting comfortably Neurovascular intact Dorsiflexion/Plantar flexion intact Skin intact, no significant bruising or swelling Compartment soft No bony deformity or bony tenderness Wiggles toes appropriately    Lab Results  Component Value Date   WBC 6.6 12/19/2022   HGB 12.2 (L) 12/19/2022   HCT 34.4 (L) 12/19/2022   MCV 85.8 12/19/2022   PLT 289 12/19/2022   BMET    Component Value Date/Time   NA 133 (L) 12/19/2022 0130   NA 140 11/14/2016 1429   K 3.7 12/19/2022 0130   CL 99 12/19/2022 0130   CO2 27 12/19/2022 0130   GLUCOSE 116 (H) 12/19/2022 0130   BUN 13 12/19/2022 0130   BUN 9 11/14/2016 1429   CREATININE 0.72 12/19/2022 0130   CALCIUM 8.5 (L) 12/19/2022 0130   GFRNONAA >60 12/19/2022 0130     Xray: successful reduction, hip aligned anatomically, no acute fracture or adverse features.  Assessment/Plan:  1 Day Post-Op   Principal Problem:   Closed dislocation of right hip (HCC)  Procedure(s) (LRB): CLOSED REDUCTION HIP (Right)  Post op recs: WB: WBAT, posterior hip precautions with knee immobilizer Abx: not indicated Dressing: none DVT prophylaxis: not indicated Follow up: 1-2 weeks after surgery with Dr. Blanchie Dessert at [facility]  Address: [address], [place], [place] [address]  Office Phone: [phone]    Cecil Cobbs 02/07/2023, 6:55 AM   Weber Cooks, MD  Contact information:    [phone] 7am-5pm epic message Dr. Blanchie Dessert, or call office for patient follow up: [phone] After hours and holidays please check Amion.[email or web address] for group call information for [facility]
